# Patient Record
Sex: Female | Born: 1940 | Race: White | Hispanic: No | Smoking: Never smoker
Health system: Southern US, Community
[De-identification: ages and names within clinical notes are randomized; demographics above are authoritative.]

## PROBLEM LIST (undated history)

## (undated) DIAGNOSIS — E079 Disorder of thyroid, unspecified: Secondary | ICD-10-CM

## (undated) DIAGNOSIS — I10 Essential (primary) hypertension: Secondary | ICD-10-CM

## (undated) DIAGNOSIS — J383 Other diseases of vocal cords: Secondary | ICD-10-CM

## (undated) DIAGNOSIS — E119 Type 2 diabetes mellitus without complications: Secondary | ICD-10-CM

## (undated) DIAGNOSIS — F419 Anxiety disorder, unspecified: Secondary | ICD-10-CM

## (undated) DIAGNOSIS — M1990 Unspecified osteoarthritis, unspecified site: Secondary | ICD-10-CM

## (undated) DIAGNOSIS — K219 Gastro-esophageal reflux disease without esophagitis: Secondary | ICD-10-CM

## (undated) DIAGNOSIS — E559 Vitamin D deficiency, unspecified: Secondary | ICD-10-CM

## (undated) DIAGNOSIS — G629 Polyneuropathy, unspecified: Secondary | ICD-10-CM

## (undated) DIAGNOSIS — M8580 Other specified disorders of bone density and structure, unspecified site: Secondary | ICD-10-CM

## (undated) DIAGNOSIS — E785 Hyperlipidemia, unspecified: Secondary | ICD-10-CM

## (undated) DIAGNOSIS — E7800 Pure hypercholesterolemia, unspecified: Secondary | ICD-10-CM

## (undated) DIAGNOSIS — C801 Malignant (primary) neoplasm, unspecified: Secondary | ICD-10-CM

## (undated) HISTORY — PX: EYE SURGERY: SHX253

---

## 2017-09-28 ENCOUNTER — Emergency Department
Admission: EM | Admit: 2017-09-28 | Discharge: 2017-09-28 | Disposition: A | Discharge status: Home / Self Care | Payer: Medicare Other | Attending: Emergency Medicine | Admitting: Emergency Medicine

## 2017-09-28 ENCOUNTER — Encounter: Payer: Self-pay | Admitting: Emergency Medicine

## 2017-09-28 DIAGNOSIS — E876 Hypokalemia: Secondary | ICD-10-CM | POA: Insufficient documentation

## 2017-09-28 DIAGNOSIS — I10 Essential (primary) hypertension: Secondary | ICD-10-CM | POA: Diagnosis present

## 2017-09-28 DIAGNOSIS — Z85820 Personal history of malignant melanoma of skin: Secondary | ICD-10-CM | POA: Insufficient documentation

## 2017-09-28 DIAGNOSIS — E1140 Type 2 diabetes mellitus with diabetic neuropathy, unspecified: Secondary | ICD-10-CM | POA: Insufficient documentation

## 2017-09-28 HISTORY — DX: Pure hypercholesterolemia, unspecified: E78.00

## 2017-09-28 HISTORY — DX: Unspecified osteoarthritis, unspecified site: M19.90

## 2017-09-28 HISTORY — DX: Malignant (primary) neoplasm, unspecified: C80.1

## 2017-09-28 HISTORY — DX: Polyneuropathy, unspecified: G62.9

## 2017-09-28 HISTORY — DX: Other specified disorders of bone density and structure, unspecified site: M85.80

## 2017-09-28 HISTORY — DX: Vitamin D deficiency, unspecified: E55.9

## 2017-09-28 HISTORY — DX: Disorder of thyroid, unspecified: E07.9

## 2017-09-28 HISTORY — DX: Type 2 diabetes mellitus without complications: E11.9

## 2017-09-28 HISTORY — DX: Gastro-esophageal reflux disease without esophagitis: K21.9

## 2017-09-28 HISTORY — DX: Essential (primary) hypertension: I10

## 2017-09-28 HISTORY — DX: Hyperlipidemia, unspecified: E78.5

## 2017-09-28 HISTORY — DX: Anxiety disorder, unspecified: F41.9

## 2017-09-28 HISTORY — DX: Other diseases of vocal cords: J38.3

## 2017-09-28 LAB — BASIC METABOLIC PANEL
ANION GAP: 10 (ref 5–15)
BUN: 14 mg/dL (ref 6–20)
CHLORIDE: 99 mmol/L — AB (ref 101–111)
CO2: 25 mmol/L (ref 22–32)
Calcium: 9 mg/dL (ref 8.9–10.3)
Creatinine, Ser: 0.69 mg/dL (ref 0.44–1.00)
GFR calc non Af Amer: >60 mL/min (ref >60)
GLUCOSE: 100 mg/dL — AB (ref 65–99)
Potassium: 3.3 mmol/L — ABNORMAL LOW (ref 3.5–5.1)
Sodium: 134 mmol/L — ABNORMAL LOW (ref 135–145)

## 2017-09-28 LAB — TROPONIN I: Troponin I: <0.03 ng/mL (ref <0.03)

## 2017-09-28 LAB — CBC
HEMATOCRIT: 41.1 % (ref 35.0–47.0)
HEMOGLOBIN: 14 g/dL (ref 12.0–16.0)
MCH: 30.8 pg (ref 26.0–34.0)
MCHC: 34 g/dL (ref 32.0–36.0)
MCV: 90.4 fL (ref 80.0–100.0)
Platelets: 278 10*3/uL (ref 150–440)
RBC: 4.54 MIL/uL (ref 3.80–5.20)
RDW: 13 % (ref 11.5–14.5)
WBC: 9.6 10*3/uL (ref 3.6–11.0)

## 2017-09-28 MED ORDER — LOSARTAN POTASSIUM 25 MG PO TABS: 75.0000 mg | ORAL_TABLET | Freq: Every day | ORAL | 1 refills | Status: AC

## 2017-09-28 MED ORDER — AMLODIPINE BESYLATE 5 MG PO TABS
5.0000 mg | ORAL_TABLET | Freq: Once | ORAL | Status: DC
  Filled 2017-09-28: qty 1

## 2017-09-28 MED ORDER — LOSARTAN POTASSIUM 50 MG PO TABS
50.0000 mg | ORAL_TABLET | Freq: Once | ORAL | Status: AC
  Administered 2017-09-28: 50 mg via ORAL
  Filled 2017-09-28: qty 1

## 2017-09-28 NOTE — ED Notes (Signed)
FN: pt states is out of town visiting her son and is having issues with her blood pressure. Takes meds for same. States she is having some dizziness. NAD at time of check  In. Pt denies any chest pain and or shortness of breath.

## 2017-09-28 NOTE — ED Triage Notes (Addendum)
Patient states that she recently moved here. Patient states that she has a history of hypertension. Patient states that 09/20/17 her blood pressure was 164/90 and was seen at [facility]. Patient states that she was started on Clonidine and that he helped her blood pressure but made her feel bad. Patient states that she made her an appointment with a new PCP but her appointment until 10/19/17. Patient states that she took her blood pressure at home today and it was 177/94. Patient states that she would like her Losartan increased or started on a new medication. Patient denies any chest pain. Patient reports that she is feeling dizzy.

## 2017-09-28 NOTE — ED Provider Notes (Signed)
[facility] Emergency Department Provider Note  ____________________________________________  Time seen: Approximately 11:21 PM  I have reviewed the triage vital signs and the nursing notes.   HISTORY  Chief Complaint Hypertension   HPI Autumn Kerr is a 77 y.o. female the history of hypertension who presents for evaluation of elevated blood pressure. Patient reports that she is here visiting her son. She is on losartan 25 mg daily. She has been having elevated blood pressure since she came to visit her son in [place] for the last few weeks. She reports that she was started on clonidine 10 days ago at urgent care however the medication made her feel very dizzy and she discontinued it. She denies headaches, dizziness, chest pain, shortness of breath, back pain, decreased urine output.   Past Medical History:  Diagnosis Date  . Anxiety   . Arthritis   . Cancer ([place])    malignant melanoma of skin  . Diabetes mellitus without complication ([place])   . GERD (gastroesophageal reflux disease)   . Hypercholesterolemia   . Hyperlipemia   . Hypertension   . Neuropathy   . Osteoarthritis   . Osteopenia   . Thyroid disease   . Vitamin D deficiency   . Vocal cord dysfunction     There are no active problems to display for this patient.   Past Surgical History:  Procedure Laterality Date  . EYE SURGERY Right    cataract    Prior to Admission medications   Not on File    Allergies Levothyroxine; Lisinopril; and Prednisone  No family history on file.  Social History Social History   Tobacco Use  . Smoking status: Never Smoker  . Smokeless tobacco: Never Used  Substance Use Topics  . Alcohol use: Yes    Comment: occ  . Drug use: No    Review of Systems  Constitutional: Negative for fever. Eyes: Negative for visual changes. ENT: Negative for sore throat. Neck: No neck pain  Cardiovascular: Negative for chest pain. Respiratory:  Negative for shortness of breath. Gastrointestinal: Negative for abdominal pain, vomiting or diarrhea. Genitourinary: Negative for dysuria. Musculoskeletal: Negative for back pain. Skin: Negative for rash. Neurological: Negative for headaches, weakness or numbness. Psych: No SI or HI  ____________________________________________   PHYSICAL EXAM:  VITAL SIGNS: ED Triage Vitals  Enc Vitals Group     BP 09/28/17 1837 (!) 186/94     Pulse Rate 09/28/17 1837 67     Resp 09/28/17 1837 20     Temp 09/28/17 1837 98.3 F (36.8 C)     Temp Source 09/28/17 1837 Oral     SpO2 09/28/17 1837 99 %     Weight 09/28/17 1838 171 lb (77.6 kg)     Height 09/28/17 1838 5\' 4"  (1.626 m)     Head Circumference --      Peak Flow --      Pain Score --      Pain Loc --      Pain Edu? --      Excl. in [place]? --     Constitutional: Alert and oriented. Well appearing and in no apparent distress. HEENT:      Head: Normocephalic and atraumatic.         Eyes: Conjunctivae are normal. Sclera is non-icteric.       Mouth/Throat: Mucous membranes are moist.       Neck: Supple with no signs of meningismus. Cardiovascular: Regular rate and rhythm. No murmurs, gallops,  or rubs. 2+ symmetrical distal pulses are present in all extremities. No JVD. Respiratory: Normal respiratory effort. Lungs are clear to auscultation bilaterally. No wheezes, crackles, or rhonchi.  Gastrointestinal: Soft, non tender, and non distended with positive bowel sounds. No rebound or guarding. Genitourinary: No CVA tenderness. Musculoskeletal: Nontender with normal range of motion in all extremities. No edema, cyanosis, or erythema of extremities. Neurologic: Normal speech and language. Face is symmetric. Moving all extremities. No gross focal neurologic deficits are appreciated. Skin: Skin is warm, dry and intact. No rash noted. Psychiatric: Mood and affect are normal. Speech and behavior are  normal.  ____________________________________________   LABS (all labs ordered are listed, but only abnormal results are displayed)  Labs Reviewed  BASIC METABOLIC PANEL - Abnormal; Notable for the following components:      Result Value   Sodium 134 (*)    Potassium 3.3 (*)    Chloride 99 (*)    Glucose, Bld 100 (*)    All other components within normal limits  CBC  TROPONIN I   ____________________________________________  EKG  ED ECG REPORT I, Rudene Re, the attending physician, personally viewed and interpreted this ECG.  Normal sinus rhythm, rate of 74, normal intervals, normal axis, no ST elevations or depressions. ____________________________________________  RADIOLOGY  none  ____________________________________________   PROCEDURES  Procedure(s) performed: None Procedures Critical Care performed:  None ____________________________________________   INITIAL IMPRESSION / ASSESSMENT AND PLAN / ED COURSE  77 y.o. female the history of hypertension who presents for evaluation of asymptomatic elevated blood pressure. Patient is on a very small dose of losartan, 25 mg. Her EKG shows no ischemic changes and labs show no end organ damage. I will increase her losartan to 75 mg a day and asked patient to keep a blood pressure diary. Recommend close follow-up in 2 days for further evaluation. Discussed signs and symptoms of an organ damage and recommended return to the emergency room if these develop. At this time since patient is asymptomatic she will be discharged home.      As part of my medical decision making, I reviewed the following data within the [place] notes reviewed and incorporated, Labs reviewed , EKG interpreted , Notes from prior ED visits and [place] Controlled Substance Database    Pertinent labs & imaging results that were available during my care of the patient were reviewed by me and considered in my medical decision  making (see chart for details).    ____________________________________________   FINAL CLINICAL IMPRESSION(S) / ED DIAGNOSES  Final diagnoses:  Essential hypertension  Hypokalemia      NEW MEDICATIONS STARTED DURING THIS VISIT:  ED Discharge Orders    None       Note:  This document was prepared using Dragon voice recognition software and may include unintentional dictation errors.    Rudene Re, MD 09/28/17 [phone]

## 2017-10-02 ENCOUNTER — Inpatient Hospital Stay
Admission: EM | Admit: 2017-10-02 | Discharge: 2017-10-04 | DRG: 641 | Disposition: A | Discharge status: Home / Self Care | Payer: Medicare Other | Attending: Internal Medicine | Admitting: Internal Medicine

## 2017-10-02 ENCOUNTER — Encounter: Payer: Self-pay | Admitting: Emergency Medicine

## 2017-10-02 ENCOUNTER — Inpatient Hospital Stay: Payer: Medicare Other

## 2017-10-02 ENCOUNTER — Other Ambulatory Visit: Payer: Self-pay

## 2017-10-02 ENCOUNTER — Emergency Department: Payer: Medicare Other

## 2017-10-02 DIAGNOSIS — R202 Paresthesia of skin: Secondary | ICD-10-CM | POA: Diagnosis not present

## 2017-10-02 DIAGNOSIS — E871 Hypo-osmolality and hyponatremia: Principal | ICD-10-CM | POA: Diagnosis present

## 2017-10-02 DIAGNOSIS — I10 Essential (primary) hypertension: Secondary | ICD-10-CM | POA: Diagnosis present

## 2017-10-02 DIAGNOSIS — N390 Urinary tract infection, site not specified: Secondary | ICD-10-CM | POA: Diagnosis not present

## 2017-10-02 DIAGNOSIS — Z85820 Personal history of malignant melanoma of skin: Secondary | ICD-10-CM | POA: Diagnosis not present

## 2017-10-02 DIAGNOSIS — Z79899 Other long term (current) drug therapy: Secondary | ICD-10-CM | POA: Diagnosis not present

## 2017-10-02 DIAGNOSIS — E876 Hypokalemia: Secondary | ICD-10-CM | POA: Diagnosis present

## 2017-10-02 DIAGNOSIS — R4781 Slurred speech: Secondary | ICD-10-CM | POA: Diagnosis present

## 2017-10-02 DIAGNOSIS — Z888 Allergy status to other drugs, medicaments and biological substances status: Secondary | ICD-10-CM

## 2017-10-02 DIAGNOSIS — E119 Type 2 diabetes mellitus without complications: Secondary | ICD-10-CM | POA: Diagnosis present

## 2017-10-02 DIAGNOSIS — E039 Hypothyroidism, unspecified: Secondary | ICD-10-CM | POA: Diagnosis present

## 2017-10-02 DIAGNOSIS — R531 Weakness: Secondary | ICD-10-CM

## 2017-10-02 DIAGNOSIS — E878 Other disorders of electrolyte and fluid balance, not elsewhere classified: Secondary | ICD-10-CM | POA: Diagnosis present

## 2017-10-02 LAB — URINE DRUG SCREEN, QUALITATIVE (ARMC ONLY)
AMPHETAMINES, UR SCREEN: NOT DETECTED
BARBITURATES, UR SCREEN: NOT DETECTED
Benzodiazepine, Ur Scrn: NOT DETECTED
Cannabinoid 50 Ng, Ur ~~LOC~~: NOT DETECTED
Cocaine Metabolite,Ur ~~LOC~~: NOT DETECTED
MDMA (Ecstasy)Ur Screen: NOT DETECTED
METHADONE SCREEN, URINE: NOT DETECTED
OPIATE, UR SCREEN: NOT DETECTED
Phencyclidine (PCP) Ur S: NOT DETECTED
TRICYCLIC, UR SCREEN: NOT DETECTED

## 2017-10-02 LAB — CBC
HEMATOCRIT: 40 % (ref 35.0–47.0)
Hemoglobin: 13.9 g/dL (ref 12.0–16.0)
MCH: 31 pg (ref 26.0–34.0)
MCHC: 34.7 g/dL (ref 32.0–36.0)
MCV: 89.2 fL (ref 80.0–100.0)
PLATELETS: 291 10*3/uL (ref 150–440)
RBC: 4.48 MIL/uL (ref 3.80–5.20)
RDW: 12.9 % (ref 11.5–14.5)
WBC: 7 10*3/uL (ref 3.6–11.0)

## 2017-10-02 LAB — BASIC METABOLIC PANEL
Anion gap: 8 (ref 5–15)
BUN: 12 mg/dL (ref 6–20)
CHLORIDE: 89 mmol/L — AB (ref 101–111)
CO2: 25 mmol/L (ref 22–32)
CREATININE: 0.75 mg/dL (ref 0.44–1.00)
Calcium: 8.9 mg/dL (ref 8.9–10.3)
GFR calc Af Amer: >60 mL/min (ref >60)
GFR calc non Af Amer: >60 mL/min (ref >60)
GLUCOSE: 99 mg/dL (ref 65–99)
POTASSIUM: 3.4 mmol/L — AB (ref 3.5–5.1)
SODIUM: 122 mmol/L — AB (ref 135–145)

## 2017-10-02 LAB — URINALYSIS, COMPLETE (UACMP) WITH MICROSCOPIC
BILIRUBIN URINE: NEGATIVE
Glucose, UA: NEGATIVE mg/dL
Hgb urine dipstick: NEGATIVE
Ketones, ur: NEGATIVE mg/dL
Nitrite: POSITIVE — AB
PH: 6 (ref 5.0–8.0)
Protein, ur: NEGATIVE mg/dL
SPECIFIC GRAVITY, URINE: 1.002 — AB (ref 1.005–1.030)

## 2017-10-02 LAB — GLUCOSE, CAPILLARY
GLUCOSE-CAPILLARY: 73 mg/dL (ref 65–99)
GLUCOSE-CAPILLARY: 74 mg/dL (ref 65–99)

## 2017-10-02 LAB — TSH: TSH: 0.522 u[IU]/mL (ref 0.350–4.500)

## 2017-10-02 MED ORDER — LEVOTHYROXINE SODIUM 125 MCG PO TABS
125.0000 ug | ORAL_TABLET | Freq: Every day | ORAL | Status: DC
  Filled 2017-10-02 (×2): qty 1

## 2017-10-02 MED ORDER — ACETAMINOPHEN 325 MG PO TABS
650.0000 mg | ORAL_TABLET | Freq: Four times a day (QID) | ORAL | Status: DC | PRN
Start: 2017-10-02 — End: 2017-10-04

## 2017-10-02 MED ORDER — STROKE: EARLY STAGES OF RECOVERY BOOK
Freq: Once | Status: AC
  Administered 2017-10-02: 21:00:00

## 2017-10-02 MED ORDER — ACETAMINOPHEN 650 MG RE SUPP: 650.0000 mg | Freq: Four times a day (QID) | RECTAL | Status: DC | PRN

## 2017-10-02 MED ORDER — ACETAMINOPHEN 160 MG/5ML PO SOLN
650.0000 mg | ORAL | Status: DC | PRN
  Filled 2017-10-02: qty 20.3

## 2017-10-02 MED ORDER — FAMOTIDINE 20 MG PO TABS
20.0000 mg | ORAL_TABLET | Freq: Every day | ORAL | Status: DC
  Administered 2017-10-04: 11:00:00 20 mg via ORAL
  Filled 2017-10-02: qty 1

## 2017-10-02 MED ORDER — POTASSIUM CHLORIDE 2 MEQ/ML IV SOLN
INTRAVENOUS | Status: DC
  Administered 2017-10-02: 23:00:00 via INTRAVENOUS
  Filled 2017-10-02 (×2): qty 1000

## 2017-10-02 MED ORDER — ONDANSETRON HCL 4 MG PO TABS: 4.0000 mg | ORAL_TABLET | Freq: Four times a day (QID) | ORAL | Status: DC | PRN

## 2017-10-02 MED ORDER — SODIUM CHLORIDE 1 G PO TABS
2.0000 g | ORAL_TABLET | Freq: Three times a day (TID) | ORAL | Status: DC
  Filled 2017-10-02: qty 2

## 2017-10-02 MED ORDER — ASPIRIN 325 MG PO TABS
325.0000 mg | ORAL_TABLET | Freq: Every day | ORAL | Status: DC
  Filled 2017-10-02: qty 1

## 2017-10-02 MED ORDER — ONDANSETRON HCL 4 MG/2ML IJ SOLN: 4.0000 mg | Freq: Four times a day (QID) | INTRAMUSCULAR | Status: DC | PRN

## 2017-10-02 MED ORDER — ATENOLOL 25 MG PO TABS
25.0000 mg | ORAL_TABLET | Freq: Every day | ORAL | Status: DC
  Filled 2017-10-02: qty 1

## 2017-10-02 MED ORDER — MORPHINE SULFATE (PF) 2 MG/ML IV SOLN: 1.0000 mg | INTRAVENOUS | Status: DC | PRN

## 2017-10-02 MED ORDER — SODIUM CHLORIDE 0.9 % IV SOLN
INTRAVENOUS | Status: DC
  Administered 2017-10-02: 21:00:00 via INTRAVENOUS

## 2017-10-02 MED ORDER — ASPIRIN 300 MG RE SUPP
300.0000 mg | Freq: Once | RECTAL | Status: AC
  Administered 2017-10-02: 300 mg via RECTAL
  Filled 2017-10-02: qty 1

## 2017-10-02 MED ORDER — INSULIN ASPART 100 UNIT/ML ~~LOC~~ SOLN
0.0000 [IU] | Freq: Three times a day (TID) | SUBCUTANEOUS | Status: DC
  Administered 2017-10-04: 2 [IU] via SUBCUTANEOUS
  Filled 2017-10-02: qty 1

## 2017-10-02 MED ORDER — HYDRALAZINE HCL 20 MG/ML IJ SOLN: 10.0000 mg | INTRAMUSCULAR | Status: DC | PRN

## 2017-10-02 MED ORDER — INSULIN ASPART 100 UNIT/ML ~~LOC~~ SOLN: 0.0000 [IU] | Freq: Every day | SUBCUTANEOUS | Status: DC

## 2017-10-02 MED ORDER — LOSARTAN POTASSIUM 50 MG PO TABS
75.0000 mg | ORAL_TABLET | Freq: Every day | ORAL | Status: DC
  Administered 2017-10-04: 11:00:00 75 mg via ORAL
  Filled 2017-10-02: qty 2

## 2017-10-02 MED ORDER — ENOXAPARIN SODIUM 40 MG/0.4ML ~~LOC~~ SOLN
40.0000 mg | SUBCUTANEOUS | Status: DC
  Administered 2017-10-02 – 2017-10-03 (×2): 40 mg via SUBCUTANEOUS
  Filled 2017-10-02 (×2): qty 0.4

## 2017-10-02 MED ORDER — HYDROCODONE-ACETAMINOPHEN 5-325 MG PO TABS: 1.0000 | ORAL_TABLET | ORAL | Status: DC | PRN

## 2017-10-02 MED ORDER — ATORVASTATIN CALCIUM 20 MG PO TABS: 20.0000 mg | ORAL_TABLET | Freq: Every day | ORAL | Status: DC

## 2017-10-02 MED ORDER — DIAZEPAM 5 MG PO TABS: 5.0000 mg | ORAL_TABLET | Freq: Three times a day (TID) | ORAL | Status: DC | PRN

## 2017-10-02 MED ORDER — SODIUM CHLORIDE 0.9 % IV BOLUS (SEPSIS)
1000.0000 mL | Freq: Once | INTRAVENOUS | Status: AC
  Administered 2017-10-02: 1000 mL via INTRAVENOUS

## 2017-10-02 MED ORDER — ACETAMINOPHEN 650 MG RE SUPP: 650.0000 mg | RECTAL | Status: DC | PRN

## 2017-10-02 MED ORDER — DEXTROSE 5 % IV SOLN
1.0000 g | INTRAVENOUS | Status: DC
  Administered 2017-10-03: 1 g via INTRAVENOUS
  Filled 2017-10-02 (×2): qty 10

## 2017-10-02 MED ORDER — DOCUSATE SODIUM 100 MG PO CAPS: 100.0000 mg | ORAL_CAPSULE | Freq: Two times a day (BID) | ORAL | Status: DC

## 2017-10-02 MED ORDER — POLYETHYLENE GLYCOL 3350 17 G PO PACK: 17.0000 g | PACK | Freq: Every day | ORAL | Status: DC | PRN

## 2017-10-02 MED ORDER — ADULT MULTIVITAMIN W/MINERALS CH
1.0000 | ORAL_TABLET | Freq: Every day | ORAL | Status: DC
  Administered 2017-10-04: 1 via ORAL
  Filled 2017-10-02: qty 1

## 2017-10-02 MED ORDER — ACETAMINOPHEN 325 MG PO TABS: 650.0000 mg | ORAL_TABLET | ORAL | Status: DC | PRN

## 2017-10-02 MED ORDER — CEFTRIAXONE SODIUM IN DEXTROSE 20 MG/ML IV SOLN
1.0000 g | Freq: Once | INTRAVENOUS | Status: AC
  Administered 2017-10-02: 1 g via INTRAVENOUS
  Filled 2017-10-02: qty 50

## 2017-10-02 MED ORDER — ENOXAPARIN SODIUM 40 MG/0.4ML ~~LOC~~ SOLN: 40.0000 mg | SUBCUTANEOUS | Status: DC

## 2017-10-02 NOTE — ED Notes (Signed)
Pt up to toilet 

## 2017-10-02 NOTE — ED Provider Notes (Signed)
[facility] Emergency Department Provider Note  ____________________________________________   I have reviewed the triage vital signs and the nursing notes.   HISTORY  Chief Complaint Weakness and Tingling   History limited by: Not Limited   HPI Autumn Kerr is a 77 y.o. female who presents to the emergency department today because of concern for weakness and tingling. She states that she has been feeling increasingly weak. She has also had associated tingling of her lower extremities. The patient states that this problem has been going on for a long time, however it has gotten worse the past day. She has also had lower abdominal pain. When she was in the emergency department four days ago she was told to increase her losartan dose, however she only did that one day because she felt like it dropped her blood pressure too low.    Per medical record review patient has a history of anxiety, htn.   Past Medical History:  Diagnosis Date  . Anxiety   . Arthritis   . Cancer ([place])    malignant melanoma of skin  . Diabetes mellitus without complication ([place])   . GERD (gastroesophageal reflux disease)   . Hypercholesterolemia   . Hyperlipemia   . Hypertension   . Neuropathy   . Osteoarthritis   . Osteopenia   . Thyroid disease   . Vitamin D deficiency   . Vocal cord dysfunction     There are no active problems to display for this patient.   Past Surgical History:  Procedure Laterality Date  . EYE SURGERY Right    cataract    Prior to Admission medications   Medication Sig Start Date End Date Taking? Authorizing Provider  losartan (COZAAR) 25 MG tablet Take 3 tablets (75 mg total) by mouth daily. 09/28/17   Rudene Re, MD    Allergies Levothyroxine; Lisinopril; and Prednisone  History reviewed. No pertinent family history.  Social History Social History   Tobacco Use  . Smoking status: Never Smoker  . Smokeless tobacco: Never Used   Substance Use Topics  . Alcohol use: Yes    Comment: occ  . Drug use: No    Review of Systems Constitutional: No fever/chills. Positive for generalized weakness.  Eyes: No visual changes. ENT: No sore throat. Cardiovascular: Denies chest pain. Respiratory: Denies shortness of breath. Gastrointestinal: Positive for lower abdominal pain. Genitourinary: Negative for dysuria. Musculoskeletal: Negative for back pain. Skin: Negative for rash. Neurological: Positive for lower extremity tingling.  ____________________________________________   PHYSICAL EXAM:  VITAL SIGNS: ED Triage Vitals  Enc Vitals Group     BP 10/02/17 1615 (!) 153/116     Pulse Rate 10/02/17 1615 67     Resp 10/02/17 1615 18     Temp 10/02/17 1615 98.6 F (37 C)     Temp Source 10/02/17 1615 Oral     SpO2 10/02/17 1615 97 %     Weight 10/02/17 1616 171 lb (77.6 kg)     Height 10/02/17 1616 5' 4.5" (1.638 m)     Head Circumference --      Peak Flow --      Pain Score 10/02/17 1615 8   Constitutional: Alert and oriented. Well appearing and in no distress. Eyes: Conjunctivae are normal.  ENT   Head: Normocephalic and atraumatic.   Nose: No congestion/rhinnorhea.   Mouth/Throat: Mucous membranes are moist.   Neck: No stridor. Hematological/Lymphatic/Immunilogical: No cervical lymphadenopathy. Cardiovascular: Normal rate, regular rhythm.  No murmurs, rubs, or gallops.  Respiratory: Normal respiratory effort without tachypnea nor retractions. Breath sounds are clear and equal bilaterally. No wheezes/rales/rhonchi. Gastrointestinal: Soft and non tender. No rebound. No guarding.  Genitourinary: Deferred Musculoskeletal: Normal range of motion in all extremities. No lower extremity edema. Neurologic:  Normal speech and language. No gross focal neurologic deficits are appreciated.  Skin:  Skin is warm, dry and intact. No rash noted. Psychiatric: Mood and affect are normal. Speech and behavior are  normal. Patient exhibits appropriate insight and judgment.  ____________________________________________    LABS (pertinent positives/negatives)  CBC wnl BMP na 122, k 3.4, 0.75 UA positive nitrite, UA 6-30  ____________________________________________   EKG  I, Nance Pear, attending physician, personally viewed and interpreted this EKG  EKG Time: 1622 Rate: 63 Rhythm: normal sinus rhythm Axis: normal Intervals: qtc 431 QRS: incomplete RBBB ST changes: no st elevation Impression: abnormal ekg   ____________________________________________    RADIOLOGY  CT head No acute intracranial process.  ____________________________________________   PROCEDURES  Procedures  ____________________________________________   INITIAL IMPRESSION / ASSESSMENT AND PLAN / ED COURSE  Pertinent labs & imaging results that were available during my care of the patient were reviewed by me and considered in my medical decision making (see chart for details).  Patient presented to the emergency department today because of concern for weakness, tingling. ddx would be broad including stroke, electrolyte abnormality, infection amongst other etiologies. CT head was negative today. Blood work did show hyponatremia. This is a significant change from blood work four days ago. Additionally urine was consistent with infection. Will plan on admission. Discussed plan and findings with patient and family.   ____________________________________________   FINAL CLINICAL IMPRESSION(S) / ED DIAGNOSES  Final diagnoses:  Weakness  Paresthesias  Lower urinary tract infectious disease     Note: This dictation was prepared with Dragon dictation. Any transcriptional errors that result from this process are unintentional     Nance Pear, MD 10/03/17 1726

## 2017-10-02 NOTE — ED Triage Notes (Signed)
Pt reports that she has history of hypertension, she states that she noticed her B/P being elevated again and that she is having numbness and tingling all over. She has slurred speech but this is not new according to her son. She has had slurred speech for the last 6 months. She reports that she took a walk today and was more weak than normal.

## 2017-10-02 NOTE — H&P (Addendum)
[place] [facility] [place] NAME: Autumn Kerr    MR#:  [id number]  DATE OF BIRTH:  1941-07-09  DATE OF ADMISSION:  10/02/2017  PRIMARY CARE PHYSICIAN: Glendon Axe, MD   REQUESTING/REFERRING PHYSICIAN:   CHIEF COMPLAINT:   Chief Complaint  Patient presents with  . Weakness  . Tingling    HISTORY OF PRESENT ILLNESS: Autumn Kerr  is a 77 y.o. female with a known history below presenting with acute pins and needles all over her entire body, dizziness, slurred speech, generalized weakness all over, was concern for high blood pressure holding, emergency room patient was found to have urinalysis suspicious for UTI, hyponatremia of 122, chloride 89, potassium 3.4, patient to the emergency room, no distress, no neurological deficits noted, patient is now being admitted for acute severe hyponatremia, probable acute urinary tract infection, and paresthesias.  PAST MEDICAL HISTORY:   Past Medical History:  Diagnosis Date  . Anxiety   . Arthritis   . Cancer ([place])    malignant melanoma of skin  . Diabetes mellitus without complication ([place])   . GERD (gastroesophageal reflux disease)   . Hypercholesterolemia   . Hyperlipemia   . Hypertension   . Neuropathy   . Osteoarthritis   . Osteopenia   . Thyroid disease   . Vitamin D deficiency   . Vocal cord dysfunction     PAST SURGICAL HISTORY:  Past Surgical History:  Procedure Laterality Date  . EYE SURGERY Right    cataract    SOCIAL HISTORY:  Social History   Tobacco Use  . Smoking status: Never Smoker  . Smokeless tobacco: Never Used  Substance Use Topics  . Alcohol use: Yes    Comment: occ    FAMILY HISTORY: History reviewed. No pertinent family history.  DRUG ALLERGIES:  Allergies  Allergen Reactions  . Levothyroxine Other (See Comments)    intolerance  . Lisinopril Cough  . Prednisone     REVIEW OF SYSTEMS:   CONSTITUTIONAL: No fever, fatigue, generalized weakness.  EYES:  No blurred or double vision.  EARS, NOSE, AND THROAT: No tinnitus or ear pain.  RESPIRATORY: No cough, shortness of breath, wheezing or hemoptysis.  CARDIOVASCULAR: No chest pain, orthopnea, edema.  GASTROINTESTINAL: No nausea, vomiting, diarrhea or abdominal pain.  GENITOURINARY: No dysuria, hematuria.  ENDOCRINE: No polyuria, nocturia,  HEMATOLOGY: No anemia, easy bruising or bleeding SKIN: No rash or lesion. MUSCULOSKELETAL: No joint pain or arthritis.   NEUROLOGIC: No tingling, numbness, weakness. Pins and needles over entire body, slurred speech PSYCHIATRY: No anxiety or depression.   MEDICATIONS AT HOME:  Prior to Admission medications   Medication Sig Start Date End Date Taking? Authorizing Provider  losartan (COZAAR) 25 MG tablet Take 3 tablets (75 mg total) by mouth daily. Patient taking differently: Take 25 mg by mouth daily.  09/28/17  Yes [place], [place], MD  ranitidine (ZANTAC) 150 MG tablet Take 150 mg by mouth 2 (two) times daily. 09/27/17  Yes [provider]  TIROSINT 125 MCG CAPS Take 125 mcg by mouth daily. 08/13/17  Yes [provider]      PHYSICAL EXAMINATION:   VITAL SIGNS: Blood pressure (!) 146/69, pulse (!) 58, temperature 98.6 F (37 C), temperature source Oral, resp. rate 18, height 5' 4.5" (1.638 m), weight 77.6 kg (171 lb), SpO2 98 %.  GENERAL:  77 y.o.-year-old patient lying in the bed with no acute distress.  EYES: Pupils equal, round, reactive to light and accommodation. No  scleral icterus. Extraocular muscles intact.  HEENT: Head atraumatic, normocephalic. Oropharynx and nasopharynx clear.  NECK:  Supple, no jugular venous distention. No thyroid enlargement, no tenderness.  LUNGS: Normal breath sounds bilaterally, no wheezing, rales,rhonchi or crepitation. No use of accessory muscles of respiration.  CARDIOVASCULAR: S1, S2 normal. No murmurs, rubs, or gallops.  ABDOMEN: Soft, nontender, nondistended. Bowel sounds present. No  organomegaly or mass.  EXTREMITIES: No pedal edema, cyanosis, or clubbing.  NEUROLOGIC: Cranial nerves II through XII are intact. Muscle strength 5/5 in all extremities. Sensation intact. Gait not checked.  PSYCHIATRIC: The patient is alert and oriented x 3.  SKIN: No obvious rash, lesion, or ulcer.   LABORATORY PANEL:   CBC Recent Labs  Lab 09/28/17 1911 10/02/17 1618  WBC 9.6 7.0  HGB 14.0 13.9  HCT 41.1 40.0  PLT 278 291  MCV 90.4 89.2  MCH 30.8 31.0  MCHC 34.0 34.7  RDW 13.0 12.9   ------------------------------------------------------------------------------------------------------------------  Chemistries  Recent Labs  Lab 09/28/17 1911 10/02/17 1618  NA 134* 122*  K 3.3* 3.4*  CL 99* 89*  CO2 25 25  GLUCOSE 100* 99  BUN 14 12  CREATININE 0.69 0.75  CALCIUM 9.0 8.9   ------------------------------------------------------------------------------------------------------------------ estimated creatinine clearance is 62 mL/min (by C-G formula based on SCr of 0.75 mg/dL). ------------------------------------------------------------------------------------------------------------------ No results for input(s): TSH, T4TOTAL, T3FREE, THYROIDAB in the last 72 hours.  Invalid input(s): FREET3   Coagulation profile No results for input(s): INR, PROTIME in the last 168 hours. ------------------------------------------------------------------------------------------------------------------- No results for input(s): DDIMER in the last 72 hours. -------------------------------------------------------------------------------------------------------------------  Cardiac Enzymes Recent Labs  Lab 09/28/17 1911  TROPONINI <0.03   ------------------------------------------------------------------------------------------------------------------ Invalid input(s):  POCBNP  ---------------------------------------------------------------------------------------------------------------  Urinalysis    Component Value Date/Time   COLORURINE YELLOW (A) 10/02/2017 1618   APPEARANCEUR HAZY (A) 10/02/2017 1618   LABSPEC 1.002 (L) 10/02/2017 1618   PHURINE 6.0 10/02/2017 1618   GLUCOSEU NEGATIVE 10/02/2017 1618   HGBUR NEGATIVE 10/02/2017 1618   BILIRUBINUR NEGATIVE 10/02/2017 1618   KETONESUR NEGATIVE 10/02/2017 1618   PROTEINUR NEGATIVE 10/02/2017 1618   NITRITE POSITIVE (A) 10/02/2017 1618   LEUKOCYTESUR SMALL (A) 10/02/2017 1618     RADIOLOGY: Ct Head Wo Contrast  Result Date: 10/02/2017 CLINICAL DATA:  Slurred speech. Numbness and tingling. Subacute neurologic deficit. Hypertension. EXAM: CT HEAD WITHOUT CONTRAST TECHNIQUE: Contiguous axial images were obtained from the base of the skull through the vertex without intravenous contrast. COMPARISON:  None. FINDINGS: Brain: No evidence of acute infarction, hemorrhage, hydrocephalus, extra-axial collection, or mass lesion/mass effect. Vascular:  No hyperdense vessel or other acute findings. Skull: No evidence of fracture or other significant bone abnormality. Sinuses/Orbits:  No acute findings. Other: None. IMPRESSION: Negative noncontrast head CT. Electronically Signed   By: Earle Gell M.D.   On: 10/02/2017 17:09    EKG: Orders placed or performed during the hospital encounter of 10/02/17  . ED EKG  . ED EKG  . EKG 12-Lead  . EKG 12-Lead    IMPRESSION AND PLAN: 1 acute severe hyponatremia Most likely secondary to poor by mouth intake Admitted to RNF, IV fluids for rehydration, salt tablets, BMP in the morning  2 acute possible urinary tract infection Rocephin IV for 3 course of follow-up on cultures  3 acute hypochloremia/hypokalemia Replete with IV fluids, by mouth potassium, check BMP/magnesium in the morning  4 acute paresthesias with slurred speech Patient is neurologically  intact May be related to possible TIA Admitted on our TIA protocol, check MRI of  the brain for further evaluation, neuro checks per routine, echocardiogram, carotid Dopplers, physical therapy to evaluate/treat, aspirin, statin therapy-check lipids in the morning  5 chronic hypothyroidism Check TSH and continue levothyroxine  6 chronic benign essential hypertension Currently uncontrolled Acute CVA thought to be unlikely given normal physiologic paresthesias Continue Cozaar, start Lopressor twice a day, hydralazine when necessary systolic blood pressure greater than 180, vitals per routine, make changes as necessary  Full code Condition stable Disposition home when 1-2 days barring any complications   All the records are reviewed and case discussed with ED provider. Management plans discussed with the patient, family and they are in agreement.  CODE STATUS: Code Status History    This patient does not have a recorded code status. Please follow your organizational policy for patients in this situation.       TOTAL TIME TAKING CARE OF THIS PATIENT: 45 minutes.    Avel Peace Alika Eppes M.D on 10/02/2017   Between 7am to 6pm - Pager - [phone]  After 6pm go to [email or web address] - password [email or web address] [facility]  Office  [phone]  CC: Primary care physician; Glendon Axe, MD   Note: This dictation was prepared with Dragon dictation along with smaller phrase technology. Any transcriptional errors that result from this process are unintentional.

## 2017-10-02 NOTE — ED Notes (Signed)
Samantha RN, aware of bed assigned  

## 2017-10-03 ENCOUNTER — Inpatient Hospital Stay
Admit: 2017-10-03 | Discharge: 2017-10-03 | Disposition: A | Discharge status: Home / Self Care | Payer: Medicare Other | Attending: Family Medicine | Admitting: Family Medicine

## 2017-10-03 ENCOUNTER — Inpatient Hospital Stay: Payer: Medicare Other

## 2017-10-03 ENCOUNTER — Other Ambulatory Visit: Payer: Self-pay

## 2017-10-03 LAB — LIPID PANEL
Cholesterol: 194 mg/dL (ref 0–200)
HDL: 65 mg/dL (ref >40)
LDL Cholesterol: 113 mg/dL — ABNORMAL HIGH (ref 0–99)
Total CHOL/HDL Ratio: 3 RATIO
Triglycerides: 78 mg/dL (ref <150)
VLDL: 16 mg/dL (ref 0–40)

## 2017-10-03 LAB — HEMOGLOBIN A1C
HEMOGLOBIN A1C: 5.5 % (ref 4.8–5.6)
Mean Plasma Glucose: 111.15 mg/dL

## 2017-10-03 LAB — BASIC METABOLIC PANEL
Anion gap: 7 (ref 5–15)
BUN: 11 mg/dL (ref 6–20)
CALCIUM: 8.8 mg/dL — AB (ref 8.9–10.3)
CO2: 25 mmol/L (ref 22–32)
CREATININE: 0.65 mg/dL (ref 0.44–1.00)
Chloride: 105 mmol/L (ref 101–111)
GFR calc Af Amer: >60 mL/min (ref >60)
Glucose, Bld: 85 mg/dL (ref 65–99)
POTASSIUM: 3.7 mmol/L (ref 3.5–5.1)
SODIUM: 137 mmol/L (ref 135–145)

## 2017-10-03 LAB — GLUCOSE, CAPILLARY
GLUCOSE-CAPILLARY: 69 mg/dL (ref 65–99)
Glucose-Capillary: 135 mg/dL — ABNORMAL HIGH (ref 65–99)
Glucose-Capillary: 85 mg/dL (ref 65–99)
Glucose-Capillary: 96 mg/dL (ref 65–99)
Glucose-Capillary: 88 mg/dL (ref 65–99)

## 2017-10-03 LAB — MAGNESIUM: MAGNESIUM: 2 mg/dL (ref 1.7–2.4)

## 2017-10-03 MED ORDER — KCL IN DEXTROSE-NACL 20-5-0.9 MEQ/L-%-% IV SOLN
INTRAVENOUS | Status: DC
  Administered 2017-10-03 – 2017-10-04 (×2): via INTRAVENOUS
  Filled 2017-10-03 (×4): qty 1000

## 2017-10-03 MED ORDER — MORPHINE SULFATE (PF) 2 MG/ML IV SOLN
1.0000 mg | INTRAVENOUS | Status: DC | PRN
  Administered 2017-10-03: 1 mg via INTRAVENOUS
  Filled 2017-10-03: qty 1

## 2017-10-03 MED ORDER — DEXTROSE 50 % IV SOLN
25.0000 mL | Freq: Once | INTRAVENOUS | Status: AC
  Administered 2017-10-03: 25 mL via INTRAVENOUS
  Filled 2017-10-03: qty 50

## 2017-10-03 NOTE — Progress Notes (Signed)
[facility] NAME: Autumn Kerr    MR#:  [id number]  DATE OF BIRTH:  29-Dec-1940  SUBJECTIVE:  CHIEF COMPLAINT: Patient speech is slurry from June of  2018.  Has some vocal cord problem, denies any tingling in her lower extremities but reporting weakness  REVIEW OF SYSTEMS:  CONSTITUTIONAL: No fever, fatigue.  Reporting generalized weakness in her lower extremities  EYES: No blurred or double vision.  EARS, NOSE, AND THROAT: No tinnitus or ear pain.  RESPIRATORY: No cough, shortness of breath, wheezing or hemoptysis.  CARDIOVASCULAR: No chest pain, orthopnea, edema.  GASTROINTESTINAL: No nausea, vomiting, diarrhea or abdominal pain.  GENITOURINARY: No dysuria, hematuria.  ENDOCRINE: No polyuria, nocturia,  HEMATOLOGY: No anemia, easy bruising or bleeding SKIN: No rash or lesion. MUSCULOSKELETAL: No joint pain or arthritis.   NEUROLOGIC: No tingling, numbness, weakness.  PSYCHIATRY: No anxiety or depression.   DRUG ALLERGIES:   Allergies  Allergen Reactions  . Levothyroxine Other (See Comments)    intolerance  . Lisinopril Cough  . Prednisone     VITALS:  Blood pressure (!) 156/81, pulse 62, temperature 97.8 F (36.6 C), temperature source Oral, resp. rate 18, height 5' 4.5" (1.638 m), weight 76.7 kg (169 lb), SpO2 100 %.  PHYSICAL EXAMINATION:  GENERAL:  77 y.o.-year-old patient lying in the bed with no acute distress.  EYES: Pupils equal, round, reactive to light and accommodation. No scleral icterus. Extraocular muscles intact.  HEENT: Head atraumatic, normocephalic. Oropharynx and nasopharynx clear.  NECK:  Supple, no jugular venous distention. No thyroid enlargement, no tenderness.  LUNGS: Normal breath sounds bilaterally, no wheezing, rales,rhonchi or crepitation. No use of accessory muscles of respiration.  CARDIOVASCULAR: S1, S2 normal. No murmurs, rubs, or gallops.  ABDOMEN: Soft, nontender, nondistended.  Bowel sounds present. No organomegaly or mass.  EXTREMITIES: No pedal edema, cyanosis, or clubbing.  NEUROLOGIC: Cranial nerves II through XII are intact. Muscle strength 5/5 in all extremities. Sensation intact. Gait not checked.  PSYCHIATRIC: The patient is alert and oriented x 3.  SKIN: No obvious rash, lesion, or ulcer.    LABORATORY PANEL:   CBC Recent Labs  Lab 10/02/17 1618  WBC 7.0  HGB 13.9  HCT 40.0  PLT 291   ------------------------------------------------------------------------------------------------------------------  Chemistries  Recent Labs  Lab 10/03/17 0411  NA 137  K 3.7  CL 105  CO2 25  GLUCOSE 85  BUN 11  CREATININE 0.65  CALCIUM 8.8*  MG 2.0   ------------------------------------------------------------------------------------------------------------------  Cardiac Enzymes Recent Labs  Lab 09/28/17 1911  TROPONINI <0.03   ------------------------------------------------------------------------------------------------------------------  RADIOLOGY:  Dg Chest 2 View  Result Date: 10/02/2017 CLINICAL DATA:  Hypertension numbness and tingling EXAM: CHEST  2 VIEW COMPARISON:  None. FINDINGS: Diffuse bronchitic changes. No consolidation or effusion. Normal cardiomediastinal silhouette with aortic atherosclerosis. Negative for pneumothorax. IMPRESSION: Diffuse bronchitic changes.  No focal pulmonary opacity. Electronically Signed   By: Donavan Foil M.D.   On: 10/02/2017 23:06   Ct Head Wo Contrast  Result Date: 10/02/2017 CLINICAL DATA:  Slurred speech. Numbness and tingling. Subacute neurologic deficit. Hypertension. EXAM: CT HEAD WITHOUT CONTRAST TECHNIQUE: Contiguous axial images were obtained from the base of the skull through the vertex without intravenous contrast. COMPARISON:  None. FINDINGS: Brain: No evidence of acute infarction, hemorrhage, hydrocephalus, extra-axial collection, or mass lesion/mass effect. Vascular:  No hyperdense vessel  or other acute findings. Skull: No evidence of fracture or other significant bone abnormality. Sinuses/Orbits:  No acute  findings. Other: None. IMPRESSION: Negative noncontrast head CT. Electronically Signed   By: Earle Gell M.D.   On: 10/02/2017 17:09   US Carotid Bilateral (at [facility] Only)  Result Date: 10/03/2017 CLINICAL DATA:  77 year old female with a history of hyponatremia EXAM: BILATERAL CAROTID DUPLEX ULTRASOUND TECHNIQUE: Pearline Cables scale imaging, color Doppler and duplex ultrasound were performed of bilateral carotid and vertebral arteries in the neck. COMPARISON:  No prior duplex FINDINGS: Criteria: Quantification of carotid stenosis is based on velocity parameters that correlate the residual internal carotid diameter with NASCET-based stenosis levels, using the diameter of the distal internal carotid lumen as the denominator for stenosis measurement. The following velocity measurements were obtained: RIGHT ICA:  Systolic 423 cm/sec, Diastolic 15 cm/sec CCA:  82 cm/sec SYSTOLIC ICA/CCA RATIO:  1.6 ECA:  143 cm/sec LEFT ICA:  Systolic 97 cm/sec, Diastolic 26 cm/sec CCA:  91 cm/sec SYSTOLIC ICA/CCA RATIO:  1.1 ECA:  107 cm/sec Right Brachial SBP: Not acquired Left Brachial SBP: Not acquired RIGHT CAROTID ARTERY: No significant calcifications of the right common carotid artery. Intermediate waveform maintained. Heterogeneous and partially calcified plaque at the right carotid bifurcation. No significant lumen shadowing. Low resistance waveform of the right ICA. No significant tortuosity. RIGHT VERTEBRAL ARTERY: Antegrade flow with low resistance waveform. LEFT CAROTID ARTERY: No significant calcifications of the left common carotid artery. Intermediate waveform maintained. Heterogeneous and partially calcified plaque at the left carotid bifurcation without significant lumen shadowing. Low resistance waveform of the left ICA. No significant tortuosity. LEFT VERTEBRAL ARTERY:  Antegrade flow with low  resistance waveform. IMPRESSION: Color duplex indicates minimal heterogeneous and calcified plaque, with no hemodynamically significant stenosis by duplex criteria in the extracranial cerebrovascular circulation. Signed, Dulcy Fanny. Earleen Newport, DO [facility] Electronically Signed   By: Corrie Mckusick D.O.   On: 10/03/2017 09:04    EKG:   Orders placed or performed during the hospital encounter of 10/02/17  . ED EKG  . ED EKG  . EKG 12-Lead  . EKG 12-Lead    ASSESSMENT AND PLAN:     1 acute severe hyponatremia With IV fluids resolved Most likely secondary to poor by mouth intake Sodium is back to normal 122-137   2 acute possible urinary tract infection Rocephin IV for 3 course of follow-up on cultures  3 acute hypochloremia/hypokalemia Improved with IV fluids   4 acute paresthesias with slurred speech-possible TIA Patient is neurologically intact Requesting neurology consult check MRI of the brain for further evaluation  neuro checks per routine echocardiogram,  carotid Dopplers-no significant stenosis  physical therapy to evaluate/treat Aspirin  LDL 113 statin therapy-check lipids in the morning  5 chronic hypothyroidism nml TSH and continue levothyroxine  6 chronic benign essential hypertension Currently uncontrolled Acute CVA thought to be unlikely given normal physiologic paresthesias Continue Cozaar, patient is started on Lopressor twice a day titrate as needed, hydralazine when necessary systolic blood pressure greater than 180, vitals per routine, make changes as necessary      All the records are reviewed and case discussed with Care Management/Social Workerr. Management plans discussed with the patient, family and they are in agreement.  CODE STATUS:  fc   TOTAL TIME TAKING CARE OF THIS PATIENT: 34  minutes.   POSSIBLE D/C IN 1-2  DAYS, DEPENDING ON CLINICAL CONDITION.  Note: This dictation  was prepared with Dragon dictation along with smaller phrase technology. Any transcriptional errors that result from this process are unintentional.   Illene Silver  Keleigh Kazee M.D on 10/03/2017 at 1:48 PM  Between 7am to 6pm - Pager - [phone] After 6pm go to [email or web address] - password [email or web address] [facility]  [facility]  Office  [phone]  CC: Primary care physician; Glendon Axe, MD

## 2017-10-03 NOTE — Progress Notes (Signed)
Hypoglycemic Event  Time: 0830 CBG: 69  Treatment: 0848 67mL D50  Symptoms: none  Follow-up CBG: Time: 0925 CBG Result: 135  Possible Reasons for Event: NPO Comments/MD notified: Gouru made aware, see new orders.   Autumn Kerr

## 2017-10-03 NOTE — Progress Notes (Signed)
PT Cancellation Note  Patient Details Name: Autumn Kerr MRN: [id number] DOB: [birth year]   Cancelled Treatment:    Reason Eval/Treat Not Completed: Patient at procedure or test/unavailable(Consult received and chart reviewed.  Patient currently off unit for diagnostic testing.  Will re-attempt at later time/date as medically appropriate.)   [name] H. Owens Shark, PT, DPT, NCS 10/03/17, 10:28 AM [phone]

## 2017-10-03 NOTE — Progress Notes (Signed)
MD notified of swallow screen results, orders placed. Will continue to monitor.

## 2017-10-04 DIAGNOSIS — R202 Paresthesia of skin: Secondary | ICD-10-CM

## 2017-10-04 DIAGNOSIS — R4781 Slurred speech: Secondary | ICD-10-CM

## 2017-10-04 LAB — ECHOCARDIOGRAM COMPLETE
HEIGHTINCHES: 64.5 in
Weight: 2704 oz

## 2017-10-04 LAB — GLUCOSE, CAPILLARY
GLUCOSE-CAPILLARY: 89 mg/dL (ref 65–99)
GLUCOSE-CAPILLARY: 121 mg/dL — AB (ref 65–99)

## 2017-10-04 MED ORDER — ASPIRIN EC 325 MG PO TBEC: 325.0000 mg | DELAYED_RELEASE_TABLET | Freq: Every day | ORAL | 3 refills | Status: AC

## 2017-10-04 MED ORDER — ATORVASTATIN CALCIUM 20 MG PO TABS: 20.0000 mg | ORAL_TABLET | Freq: Every day | ORAL | 0 refills | Status: AC

## 2017-10-04 MED ORDER — ADULT MULTIVITAMIN W/MINERALS CH: 1.0000 | ORAL_TABLET | Freq: Every day | ORAL | Status: AC

## 2017-10-04 MED ORDER — CEPHALEXIN 500 MG PO CAPS: 500.0000 mg | ORAL_CAPSULE | Freq: Two times a day (BID) | ORAL | 0 refills | Status: AC

## 2017-10-04 MED ORDER — METOPROLOL TARTRATE 25 MG PO TABS: 25.0000 mg | ORAL_TABLET | Freq: Two times a day (BID) | ORAL | 0 refills | Status: AC

## 2017-10-04 MED ORDER — CEPHALEXIN 500 MG PO CAPS
500.0000 mg | ORAL_CAPSULE | Freq: Two times a day (BID) | ORAL | Status: DC
  Administered 2017-10-04: 13:00:00 500 mg via ORAL
  Filled 2017-10-04: qty 1

## 2017-10-04 NOTE — Progress Notes (Signed)
OT Cancellation Note  Patient Details Name: Autumn Kerr MRN: [id number] DOB: [birth year]   Cancelled Treatment:    Reason Eval/Treat Not Completed: Patient at procedure or test/ unavailable. Order received, chart reviewed, SLP entering room upon initial attempt. Will re-attempt OT evaluation at later time as pt is available and medically appropriate.  Jeni Salles, MPH, MS, OTR/L ascom [phone] 10/04/17, 9:16 AM

## 2017-10-04 NOTE — Care Management Important Message (Signed)
Important Message  Patient Details  Name: Autumn Kerr MRN: [id number] Date of Birth: Jan 26, 1941   Medicare Important Message Given:  Yes    Shelbie Ammons, RN 10/04/2017, 7:23 AM

## 2017-10-04 NOTE — Progress Notes (Signed)
Discharge instructions along with home medications and follow up gone over with patient and son. Both verbalize that they understood instructions. 3 prescriptions given to patient. IV and tele removed. Pt being discharged home on room air, no distress noted. Ammie Dalton, RN

## 2017-10-04 NOTE — Evaluation (Signed)
Physical Therapy Evaluation Patient Details Name: Autumn Kerr MRN: [id number] DOB: Oct 24, 1940 Today's Date: 10/04/2017   History of Present Illness  77yo female pt presented on 1/26 with complaints of paresthesias over her entire body dizziness and generalized weakness.  Also patient also reported slurred speech. Found to have hyponatremia and possible UTI.  CT head and MRI negative for acute change.  Clinical Impression  Upon evaluation, patient alert and oriented; follows commands and demonstrates good effort with all functional activities.  Speech generally slurred and "heavy/thick" at all times, but no word-finding, comprehension or expression deficits noted.  Mildly impulsive; intermittent cuing for safety awareness.  Bilat UE/LE strength grossly symmetrical and WFL.  Does demonstate mild coordination deficits R UE and endorses symmetrical paresthesias bilat mid-feet distally (improved since admission per patient report).  Able to complete bed mobility, sit/stand, basic transfers and gait (600') without assist device, close sup/mod indep.  Higher-level balance deficits noted with dynamic gait components (modified DGI 9/12), cga for balance correction. Would benefit from skilled PT to address above deficits and promote optimal return to PLOF; recommend transition to home with outpatient PT follow up to address higher-level balance deficits.    Follow Up Recommendations Outpatient PT    Equipment Recommendations       Recommendations for Other Services       Precautions / Restrictions Precautions Precautions: Fall Restrictions Weight Bearing Restrictions: No      Mobility  Bed Mobility Overal bed mobility: Modified Independent             General bed mobility comments: good effort, no LOB  Transfers Overall transfer level: Needs assistance   Transfers: Sit to/from Stand Sit to Stand: Modified independent (Device/Increase time)         General transfer comment: good  LE strength/stability; mod impulsivity with position changes and mobility  Ambulation/Gait Ambulation/Gait assistance: Supervision;Modified independent (Device/Increase time) Ambulation Distance (Feet): 600 Feet Assistive device: None       General Gait Details: reciprocal stepping pattern with mild increase in R lateral sway, R LE scissoring at times with inconsistent R > L step height/length (especially with head turns and dynamic gait components)  Stairs            Wheelchair Mobility    Modified Rankin (Stroke Patients Only)       Balance Overall balance assessment: Needs assistance Sitting-balance support: No upper extremity supported;Feet supported Sitting balance-Leahy Scale: Good     Standing balance support: No upper extremity supported Standing balance-Leahy Scale: Fair Standing balance comment: impulsive-very quick to move between seating surfaces/positions.  Able to reach >6" outside BOS (using contralateral UE stabilization as needed), retrieve item from floor without LOB               High Level Balance Comments: positive romberg testing, requiring cga/min assist for correction of sway/LOB during eyes-closed stance             Pertinent Vitals/Pain Pain Assessment: No/denies pain    Home Living Family/patient expects to be discharged to:: Private residence Living Arrangements: Children Available Help at Discharge: Family;Available PRN/intermittently Type of Home: House Home Access: Stairs to enter Entrance Stairs-Rails: Right;Left;Can reach both Entrance Stairs-Number of Steps: 4 Home Layout: One level Home Equipment: None Additional Comments: pt has been visiting son who lives in [place] (all home info obtained is son's home), pt verbalizes plan to return to apartment in [place] in April    Prior Function Level of Independence: Independent  Comments: Pt notes independent with mobility, ADL, IADL, and no falls in 12 months. Pt  notes that she doesn't have a car here and son is able to take her anywhere she needs to go. Her car is in [place] where she primarily lives.     Hand Dominance        Extremity/Trunk Assessment   Upper Extremity Assessment Upper Extremity Assessment: Overall WFL for tasks assessed(mild coordination R UE with finger/nose?  otherwise, symmetrical and WFL)    Lower Extremity Assessment Lower Extremity Assessment: Overall WFL for tasks assessed(grossly 4+/5 throughout; no focal weakness; negative babinski bilat.  Mild paresthesias bilat mid-feet distally (improved since admission))    Cervical / Trunk Assessment Cervical / Trunk Assessment: Normal  Communication   Communication: (slurred, "heavy" speech)  Cognition Arousal/Alertness: Awake/alert Behavior During Therapy: WFL for tasks assessed/performed;Impulsive Overall Cognitive Status: Within Functional Limits for tasks assessed                                        General Comments      Exercises Other Exercises Other Exercises: Toilet transfer, ambulatory without assist device, sup/mod indep; sit/stand from standard toilet, sup/mod indep; standing balance for clothing management, hand hygiene, close sup.  Does require UE support for functional activities requiring periods of SLS (i.e., stepping into undergarments)   Assessment/Plan    PT Assessment Patient needs continued PT services  PT Problem List Decreased activity tolerance;Decreased balance;Decreased mobility;Decreased coordination;Decreased knowledge of use of DME;Decreased safety awareness;Decreased knowledge of precautions       PT Treatment Interventions DME instruction;Gait training;Therapeutic exercise;Stair training;Functional mobility training;Therapeutic activities;Neuromuscular re-education;Balance training;Patient/family education    PT Goals (Current goals can be found in the Care Plan section)  Acute Rehab PT Goals Patient Stated Goal: to  return to son's house and then return home to [place] in April PT Goal Formulation: With patient Time For Goal Achievement: 10/18/17 Potential to Achieve Goals: Good    Frequency Min 2X/week   Barriers to discharge        Co-evaluation               AM-PAC PT "6 Clicks" Daily Activity  Outcome Measure Difficulty turning over in bed (including adjusting bedclothes, sheets and blankets)?: None Difficulty moving from lying on back to sitting on the side of the bed? : None Difficulty sitting down on and standing up from a chair with arms (e.g., wheelchair, bedside commode, etc,.)?: A Little Help needed moving to and from a bed to chair (including a wheelchair)?: A Little Help needed walking in hospital room?: A Little Help needed climbing 3-5 steps with a railing? : A Little 6 Click Score: 20    End of Session Equipment Utilized During Treatment: Gait belt Activity Tolerance: Patient tolerated treatment well Patient left: in bed;with call bell/phone within reach Nurse Communication: Mobility status PT Visit Diagnosis: Muscle weakness (generalized) (M62.81);Difficulty in walking, not elsewhere classified (R26.2)    Time: 0938-1829 PT Time Calculation (min) (ACUTE ONLY): 22 min   Charges:   PT Evaluation $PT Eval Moderate Complexity: 1 Mod PT Treatments $Therapeutic Activity: 8-22 mins   PT G Codes:        Tannar Broker H. Owens Shark, PT, DPT, NCS 10/04/17, 2:19 PM [phone]

## 2017-10-04 NOTE — Evaluation (Signed)
Occupational Therapy Evaluation Patient Details Name: Autumn Kerr MRN: [id number] DOB: 1941-02-18 Today's Date: 10/04/2017    History of Present Illness 77yo female pt presented on 1/26 with complaints of paresthesias over her entire body dizziness and generalized weakness.  Also patient also reported slurred speech. Found to have hyponatremia and possible UTI.    Clinical Impression   Pt seen for OT evaluation this date. Prior to admission, pt independent with mobility, ADL, and IADL tasks. Pt currently has been living with her son in [place], with plans to return to her apartment in [place] in April. Pt does not have a car here, but son is able to drive her anywhere she needs to go. Pt presents at baseline functional independence with ADL and mobility tasks. No visual/cognitive/strength/coordination deficits noted with testing. Pt endorses mild tingling in both feet still, but otherwise reports feeling back to baseline. 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 No additional skilled OT needs at this time. Will sign off. Please re-consult if additional needs arise.     Follow Up Recommendations  No OT follow up    Equipment Recommendations  None recommended by OT    Recommendations for Other Services       Precautions / Restrictions Precautions Precautions: Fall Restrictions Weight Bearing Restrictions: No      Mobility Bed Mobility Overal bed mobility: Independent             General bed mobility comments: good effort, no LOB  Transfers Overall transfer level: Independent               General transfer comment: good effort, no LOB    Balance Overall balance assessment: No apparent balance deficits (not formally assessed)                                         ADL either performed or assessed with clinical judgement   ADL Overall ADL's : At  baseline;Independent                                       General ADL Comments: no difficulty noted during assessment with ADL tasks     Vision Baseline Vision/History: Wears glasses Wears Glasses: At all times Patient Visual Report: No change from baseline Vision Assessment?: No apparent visual deficits     Perception     Praxis      Pertinent Vitals/Pain Pain Assessment: No/denies pain     Hand Dominance     Extremity/Trunk Assessment Upper Extremity Assessment Upper Extremity Assessment: Overall WFL for tasks assessed(intact sensation/strength/coordination bilaterally)   Lower Extremity Assessment Lower Extremity Assessment: Overall WFL for tasks assessed(intact strength/coordination bilaterally, pt notes some mild tingling in both feet still)   Cervical / Trunk Assessment Cervical / Trunk Assessment: Normal   Communication Communication Communication: No difficulties(mild slurred speech, pt notes due to scar tissue on vocal cords)   Cognition Arousal/Alertness: Awake/alert Behavior During Therapy: WFL for tasks assessed/performed Overall Cognitive Status: Within Functional Limits for tasks assessed                                     General Comments  Exercises     Shoulder Instructions      Home Living Family/patient expects to be discharged to:: Private residence Living Arrangements: Children(son) Available Help at Discharge: Family;Available PRN/intermittently(son works) Type of Home: House Home Access: Stairs to enter [employer] of Steps: 4 Entrance Stairs-Rails: Right;Left(cannot reach both) Home Layout: One level     Bathroom Shower/Tub: Walk-in shower;Tub/shower unit(uses tub shower)   Bathroom Toilet: Handicapped height     Home Equipment: None   Additional Comments: pt has been visiting son who lives in [place] (all home info obtained is son's home), pt verbalizes plan to return to  apartment in [place] in April      Prior Functioning/Environment Level of Independence: Independent        Comments: Pt notes independent with mobility, ADL, IADL, and no falls in 12 months. Pt notes that she doesn't have a car here and son is able to take her anywhere she needs to go. Her car is in [place] where she primarily lives.        OT Problem List:        OT Treatment/Interventions:      OT Goals(Current goals can be found in the care plan section) Acute Rehab OT Goals Patient Stated Goal: to return to son's house and then return home to [place] in April OT Goal Formulation: All assessment and education complete, DC therapy  OT Frequency:     Barriers to D/C:            Co-evaluation              AM-PAC PT "6 Clicks" Daily Activity     Outcome Measure Help from another person eating meals?: None Help from another person taking care of personal grooming?: None Help from another person toileting, which includes using toliet, bedpan, or urinal?: None Help from another person bathing (including washing, rinsing, drying)?: None Help from another person to put on and taking off regular upper body clothing?: None Help from another person to put on and taking off regular lower body clothing?: None 6 Click Score: 24   End of Session    Activity Tolerance: Patient tolerated treatment well Patient left: in bed;with call bell/phone within reach;with bed alarm set  OT Visit Diagnosis: Other abnormalities of gait and mobility (R26.89)                Time: 4097-3532 OT Time Calculation (min): 13 min Charges:  OT General Charges $OT Visit: 1 Visit OT Evaluation $OT Eval Low Complexity: 1 Low  Jeni Salles, MPH, MS, OTR/L ascom [phone] 10/04/17, 12:02 PM

## 2017-10-04 NOTE — Consult Note (Signed)
Reason for Consult:Tingling, slurred speech Referring Physician: Gouru  CC: Tingling, slurred speech  HPI: Autumn Kerr is an 77 y.o. female who presented on 126 with complaints of paresthesias over her entire body dizziness and generalized weakness.  Also patient also reported slurred speech at that time.  Patient is noted to have metabolic issues.  These have been addressed and her paresthesias are improved.  She continues to have slurred speech.  She reports that she has had slurred speech since June of last year.  She reports that the slurred speech was of gradual onset and worsened over the course of a month.  After a month's time it seemed to plateau.  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  This was due to reflux.  She is being treated for her reflux.  Past Medical History:  Diagnosis Date  . Anxiety   . Arthritis   . Cancer ([place])    malignant melanoma of skin  . Diabetes mellitus without complication ([place])   . GERD (gastroesophageal reflux disease)   . Hypercholesterolemia   . Hyperlipemia   . Hypertension   . Neuropathy   . Osteoarthritis   . Osteopenia   . Thyroid disease   . Vitamin D deficiency   . Vocal cord dysfunction     Past Surgical History:  Procedure Laterality Date  . EYE SURGERY Right    cataract    History reviewed. No pertinent family history.  Social History:  reports that  has never smoked. she has never used smokeless tobacco. She reports that she drinks alcohol. She reports that she does not use drugs.  Allergies  Allergen Reactions  . Levothyroxine Other (See Comments)    intolerance  . Lisinopril Cough  . Prednisone     Medications:  I have reviewed the patient's current medications. Prior to Admission:  Medications Prior to Admission  Medication Sig Dispense Refill Last Dose  . losartan (COZAAR) 25 MG tablet Take 3 tablets (75 mg total) by mouth daily. (Patient taking differently: Take 25 mg by mouth  daily. ) 30 tablet 1 10/02/2017 at 0800  . ranitidine (ZANTAC) 150 MG tablet Take 150 mg by mouth 2 (two) times daily.   10/02/2017 at 0800  . TIROSINT 125 MCG CAPS Take 125 mcg by mouth daily.   10/02/2017 at 0800   Scheduled: . atorvastatin  20 mg Oral q1800  . enoxaparin (LOVENOX) injection  40 mg Subcutaneous Q24H  . famotidine  20 mg Oral Daily  . insulin aspart  0-15 Units Subcutaneous TID WC  . insulin aspart  0-5 Units Subcutaneous QHS  . levothyroxine  125 mcg Oral QAC breakfast  . losartan  75 mg Oral Daily  . multivitamin with minerals  1 tablet Oral Daily    ROS: History obtained from the patient  General ROS: negative for - chills, fatigue, fever, night sweats, weight gain or weight loss Psychological ROS: negative for - behavioral disorder, hallucinations, memory difficulties, mood swings or suicidal ideation Ophthalmic ROS: negative for - blurry vision, double vision, eye pain or loss of vision ENT ROS: dizziness Allergy and Immunology ROS: negative for - hives or itchy/watery eyes Hematological and Lymphatic ROS: negative for - bleeding problems, bruising or swollen lymph nodes Endocrine ROS: negative for - galactorrhea, hair pattern changes, polydipsia/polyuria or temperature intolerance Respiratory ROS: negative for - cough, hemoptysis, shortness of breath or wheezing Cardiovascular ROS: negative for - chest pain, dyspnea on exertion, edema or irregular heartbeat  Gastrointestinal ROS: negative for - abdominal pain, diarrhea, hematemesis, nausea/vomiting or stool incontinence Genito-Urinary ROS: negative for - dysuria, hematuria, incontinence or urinary frequency/urgency Musculoskeletal ROS: generalized weakness Neurological ROS: as noted in HPI Dermatological ROS: negative for rash and skin lesion changes  Physical Examination: Blood pressure (!) 143/58, pulse (!) 58, temperature 97.7 F (36.5 C), resp. rate 14, height 5' 4.5" (1.638 m), weight 76.7 kg (169 lb), SpO2  96 %.  HEENT-  Normocephalic, no lesions, without obvious abnormality.  Normal external eye and conjunctiva.  Normal TM's bilaterally.  Normal auditory canals and external ears. Normal external nose, mucus membranes and septum.  Normal pharynx. Cardiovascular- S1, S2 normal, pulses palpable throughout   Lungs- chest clear, no wheezing, rales, normal symmetric air entry Abdomen- soft, non-tender; bowel sounds normal; no masses,  no organomegaly Extremities- mild LE edema Lymph-no adenopathy palpable Musculoskeletal-no joint tenderness, deformity or swelling Skin-warm and dry, no hyperpigmentation, vitiligo, or suspicious lesions  Neurological Examination   Mental Status: Alert, oriented, thought content appropriate.  Speech fluent without evidence of aphasia.  Able to repeat tongue twisters.  Able to follow 3 step commands without difficulty. Cranial Nerves: II: Discs flat bilaterally; Visual fields grossly normal, pupils equal, round, reactive to light and accommodation III,IV, VI: ptosis not present, extra-ocular motions intact bilaterally V,VII: smile symmetric, facial light touch sensation normal bilaterally VIII: hearing normal bilaterally IX,X: gag reflex present XI: bilateral shoulder shrug XII: midline tongue extension Motor: Right : Upper extremity   5/5    Left:     Upper extremity   5/5  Lower extremity   5/5     Lower extremity   5/5 Tone and bulk:normal tone throughout; no atrophy noted Sensory: Pinprick and light touch intact throughout, bilaterally Deep Tendon Reflexes: 1+ and symmetric with absent AJ's bilaterally Plantars: Right: mute   Left:mute Cerebellar: Normal finger-to-nose and normal heel-to-shin testing bilaterally Gait: not tested due to safety concerns    Laboratory Studies:   Basic Metabolic Panel: Recent Labs  Lab 09/28/17 1911 10/02/17 1618 10/03/17 0411  NA 134* 122* 137  K 3.3* 3.4* 3.7  CL 99* 89* 105  CO2 25 25 25   GLUCOSE 100* 99 85   BUN 14 12 11   CREATININE 0.69 0.75 0.65  CALCIUM 9.0 8.9 8.8*  MG  --   --  2.0    Liver Function Tests: No results for input(s): AST, ALT, ALKPHOS, BILITOT, PROT, ALBUMIN in the last 168 hours. No results for input(s): LIPASE, AMYLASE in the last 168 hours. No results for input(s): AMMONIA in the last 168 hours.  CBC: Recent Labs  Lab 09/28/17 1911 10/02/17 1618  WBC 9.6 7.0  HGB 14.0 13.9  HCT 41.1 40.0  MCV 90.4 89.2  PLT 278 291    Cardiac Enzymes: Recent Labs  Lab 09/28/17 1911  TROPONINI <0.03    BNP: Invalid input(s): POCBNP  CBG: Recent Labs  Lab 10/03/17 0925 10/03/17 1221 10/03/17 1648 10/03/17 2056 10/04/17 0732  GLUCAP 135* 85 96 88 87    Microbiology: Results for orders placed or performed during the hospital encounter of 10/02/17  Urine Culture     Status: Abnormal (Preliminary result)   Collection Time: 10/02/17  4:18 PM  Result Value Ref Range Status   Specimen Description   Final    URINE, RANDOM Performed at [facility], [address] [place], [place] [address]    Special Requests   Final    NONE Performed at [facility],  [place], [place] [address]    Culture >=100,000 COLONIES/mL GRAM NEGATIVE RODS (A)  Final   Report Status PENDING  Incomplete    Coagulation Studies: No results for input(s): LABPROT, INR in the last 72 hours.  Urinalysis:  Recent Labs  Lab 10/02/17 1618  COLORURINE YELLOW*  LABSPEC 1.002*  PHURINE 6.0  GLUCOSEU NEGATIVE  HGBUR NEGATIVE  BILIRUBINUR NEGATIVE  KETONESUR NEGATIVE  PROTEINUR NEGATIVE  NITRITE POSITIVE*  LEUKOCYTESUR SMALL*    Lipid Panel:     Component Value Date/Time   CHOL 194 10/03/2017 0411   TRIG 78 10/03/2017 0411   HDL 65 10/03/2017 0411   CHOLHDL 3.0 10/03/2017 0411   VLDL 16 10/03/2017 0411   LDLCALC 113 (H) 10/03/2017 0411    HgbA1C:  Lab Results  Component Value Date   HGBA1C 5.5 10/03/2017    Urine Drug Screen:       Component Value Date/Time   LABOPIA NONE DETECTED 10/02/2017 1618   COCAINSCRNUR NONE DETECTED 10/02/2017 1618   LABBENZ NONE DETECTED 10/02/2017 1618   AMPHETMU NONE DETECTED 10/02/2017 1618   THCU NONE DETECTED 10/02/2017 1618   LABBARB NONE DETECTED 10/02/2017 1618    Alcohol Level: No results for input(s): ETH in the last 168 hours.  Other results: EKG: sinus rhythm at 63 bpm.  Imaging: Dg Chest 2 View  Result Date: 10/02/2017 CLINICAL DATA:  Hypertension numbness and tingling EXAM: CHEST  2 VIEW COMPARISON:  None. FINDINGS: Diffuse bronchitic changes. No consolidation or effusion. Normal cardiomediastinal silhouette with aortic atherosclerosis. Negative for pneumothorax. IMPRESSION: Diffuse bronchitic changes.  No focal pulmonary opacity. Electronically Signed   By: Donavan Foil M.D.   On: 10/02/2017 23:06   Ct Head Wo Contrast  Result Date: 10/02/2017 CLINICAL DATA:  Slurred speech. Numbness and tingling. Subacute neurologic deficit. Hypertension. EXAM: CT HEAD WITHOUT CONTRAST TECHNIQUE: Contiguous axial images were obtained from the base of the skull through the vertex without intravenous contrast. COMPARISON:  None. FINDINGS: Brain: No evidence of acute infarction, hemorrhage, hydrocephalus, extra-axial collection, or mass lesion/mass effect. Vascular:  No hyperdense vessel or other acute findings. Skull: No evidence of fracture or other significant bone abnormality. Sinuses/Orbits:  No acute findings. Other: None. IMPRESSION: Negative noncontrast head CT. Electronically Signed   By: Earle Gell M.D.   On: 10/02/2017 17:09   Mr Brain Wo Contrast  Result Date: 10/03/2017 CLINICAL DATA:  Facial paresthesia.  Side not specified. EXAM: MRI HEAD WITHOUT CONTRAST MRA HEAD WITHOUT CONTRAST TECHNIQUE: Multiplanar, multiecho pulse sequences of the brain and surrounding structures were obtained without intravenous contrast. Angiographic images of the head were obtained using MRA technique  without contrast. COMPARISON:  Head CT from yesterday FINDINGS: MRI HEAD FINDINGS Brain: No explanation for symptoms. Unremarkable appearance of the brainstem, cisterns, Meckel's cave, and skull base. No acute infarct, hemorrhage, hydrocephalus, or masslike finding. Few FLAIR hyperintensities in the cerebral white matter, age normal. Vascular: Major flow voids are preserved. Skull and upper cervical spine: Negative for marrow lesion. Sinuses/Orbits: No acute finding. Congested appearance of the nasal mucosa. Right cataract resection. MRA HEAD FINDINGS Symmetric carotid arteries. Dominant right vertebral artery with most of the left vertebral artery flow into the pica. There is atherosclerotic type undulation of the vertebral and basilar arteries. High-grade bilateral proximal P2 segment stenoses followed by diffuse atheromatous irregularity and poor signal. Less extensive atheromatous changes in the anterior circulation, although there is mild to moderate right M1 and left M2 branch segment stenosis. Negative for aneurysm  or signs of vascular malformation. IMPRESSION: No acute finding, brain MRI: Age normal appearance.  No explanation for facial paresthesia. Intracranial MRA: 1. No acute finding. 2. 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 3. Less advanced atheromatous changes in the anterior circulation, most notably mild to moderate right M1 and left M2 branch stenosis. Electronically Signed   By: Monte Fantasia M.D.   On: 10/03/2017 20:30   US Carotid Bilateral (at [facility] Only)  Result Date: 10/03/2017 CLINICAL DATA:  77 year old female with a history of hyponatremia EXAM: BILATERAL CAROTID DUPLEX ULTRASOUND TECHNIQUE: Pearline Cables scale imaging, color Doppler and duplex ultrasound were performed of bilateral carotid and vertebral arteries in the neck. COMPARISON:  No prior duplex FINDINGS: Criteria: Quantification of carotid stenosis is based on velocity parameters that correlate the  residual internal carotid diameter with NASCET-based stenosis levels, using the diameter of the distal internal carotid lumen as the denominator for stenosis measurement. The following velocity measurements were obtained: RIGHT ICA:  Systolic 409 cm/sec, Diastolic 15 cm/sec CCA:  82 cm/sec SYSTOLIC ICA/CCA RATIO:  1.6 ECA:  143 cm/sec LEFT ICA:  Systolic 97 cm/sec, Diastolic 26 cm/sec CCA:  91 cm/sec SYSTOLIC ICA/CCA RATIO:  1.1 ECA:  107 cm/sec Right Brachial SBP: Not acquired Left Brachial SBP: Not acquired RIGHT CAROTID ARTERY: No significant calcifications of the right common carotid artery. Intermediate waveform maintained. Heterogeneous and partially calcified plaque at the right carotid bifurcation. No significant lumen shadowing. Low resistance waveform of the right ICA. No significant tortuosity. RIGHT VERTEBRAL ARTERY: Antegrade flow with low resistance waveform. LEFT CAROTID ARTERY: No significant calcifications of the left common carotid artery. Intermediate waveform maintained. Heterogeneous and partially calcified plaque at the left carotid bifurcation without significant lumen shadowing. Low resistance waveform of the left ICA. No significant tortuosity. LEFT VERTEBRAL ARTERY:  Antegrade flow with low resistance waveform. IMPRESSION: Color duplex indicates minimal heterogeneous and calcified plaque, with no hemodynamically significant stenosis by duplex criteria in the extracranial cerebrovascular circulation. Signed, Dulcy Fanny. Earleen Newport, DO [facility] Electronically Signed   By: Corrie Mckusick D.O.   On: 10/03/2017 09:04   Mr Jodene Nam Head/brain WJ Cm  Result Date: 10/03/2017 CLINICAL DATA:  Facial paresthesia.  Side not specified. EXAM: MRI HEAD WITHOUT CONTRAST MRA HEAD WITHOUT CONTRAST TECHNIQUE: Multiplanar, multiecho pulse sequences of the brain and surrounding structures were obtained without intravenous contrast. Angiographic images of the  head were obtained using MRA technique without contrast. COMPARISON:  Head CT from yesterday FINDINGS: MRI HEAD FINDINGS Brain: No explanation for symptoms. Unremarkable appearance of the brainstem, cisterns, Meckel's cave, and skull base. No acute infarct, hemorrhage, hydrocephalus, or masslike finding. Few FLAIR hyperintensities in the cerebral white matter, age normal. Vascular: Major flow voids are preserved. Skull and upper cervical spine: Negative for marrow lesion. Sinuses/Orbits: No acute finding. Congested appearance of the nasal mucosa. Right cataract resection. MRA HEAD FINDINGS Symmetric carotid arteries. Dominant right vertebral artery with most of the left vertebral artery flow into the pica. There is atherosclerotic type undulation of the vertebral and basilar arteries. High-grade bilateral proximal P2 segment stenoses followed by diffuse atheromatous irregularity and poor signal. Less extensive atheromatous changes in the anterior circulation, although there is mild to moderate right M1 and left M2 branch segment stenosis. Negative for aneurysm or signs of vascular malformation. IMPRESSION: No acute finding, brain MRI: Age normal appearance.  No explanation for facial paresthesia. Intracranial MRA: 1. No acute finding. 2. Advanced atheromatous type stenosis  of the bilateral P2 segments. 3. Less advanced atheromatous changes in the anterior circulation, most notably mild to moderate right M1 and left M2 branch stenosis. Electronically Signed   By: Monte Fantasia M.D.   On: 10/03/2017 20:30     Assessment/Plan: 77 year old female presenting with paresthesias.  Patient noted to be hyponatremic and hypokalemic.  Also noted to have a urinary tract infection.  Paresthesias improved today.  Does have issues with speech.  Reports that these date back to June of this year and were gradual in onset.  History does not suggest an ischemic etiology.  Although there have been some questions of whether or not  she has an apraxia this does not seem to be the case on examination either.  MRI of the brain reviewed and shows no acute changes.  Although there is some atrophy noted there does not appear to be in the etiology for her slurred speech on imaging.  MRA shows diffuse atherosclerosis.  But she has had a GI workup in the past that has revealed some vocal cord abnormalities.  Suspect this is the etiology of her speech issues.  Recommendations: 1.  Aspirin enteric-coated 325 mg daily.   2. Patient to follow-up with her physicians when she returns home on an outpatient basis. 3. No further neurologic intervention is recommended at this time.  If further questions arise, please call or page at that time.  Thank you for allowing neurology to participate in the care of this patient.   Alexis Goodell, MD Neurology [phone] 10/04/2017, 10:22 AM

## 2017-10-04 NOTE — Evaluation (Addendum)
Clinical/Bedside Swallow Evaluation Patient Details  Name: Maribel Hadley MRN: [id number] Date of Birth: 1940-09-27  Today's Date: 10/04/2017 Time: SLP Start Time (ACUTE ONLY): 0845 SLP Stop Time (ACUTE ONLY): 0945 SLP Time Calculation (min) (ACUTE ONLY): 60 min  Past Medical History:  Past Medical History:  Diagnosis Date  . Anxiety   . Arthritis   . Cancer ([place])    malignant melanoma of skin  . Diabetes mellitus without complication ([place])   . GERD (gastroesophageal reflux disease)   . Hypercholesterolemia   . Hyperlipemia   . Hypertension   . Neuropathy   . Osteoarthritis   . Osteopenia   . Thyroid disease   . Vitamin D deficiency   . Vocal cord dysfunction    Past Surgical History:  Past Surgical History:  Procedure Laterality Date  . EYE SURGERY Right    cataract   HPI:  Pt is a 77 y.o. female who presents to the emergency department today because of concern for weakness and tingling. She states that she has been feeling increasingly weak. She has also had associated tingling of her lower extremities. The patient states that this problem has been going on for a long time, however it has gotten worse the past day. She has also had lower abdominal pain. When she was in the emergency department four days ago she was told to increase her losartan dose, however she only did that one day because she felt like it dropped her blood pressure too low. Pt has a h/o GERD, DM, Ca, anxiety, vocal cord dysfunction, OA, HTN, neuropathy, Vit. D def. Pt has a h/o change/decline in her articulation of her speech since June 2018 per chart notes and her report. Pt stated her family has difficulty understanding her at times and that she "texts more w/ them now" because of it. Pt denies any receptive or expressive language decline (no receptive/expressive Aphasia appeared apparent during the BSE). Pt appeared to exhibit min Apraxic-like behaviors during assessment: slower rate, deliberate speech, lingual  movements during rapid, coordinated volitional tasks were slow and imprecise. Articulation was reduced w/ certain speech sounds. Pt does have a vocal cord dysfunction dx but no further details re: this.    Assessment / Plan / Recommendation Clinical Impression  Pt appears to present w/ adequate oropharyngeal phase swallow function w/ reduced risk for aspiration from an oropharyngeal phase standpoint. Pt would benefit from following general aspiration precautions. Oral phase appeared grossly wfl for bolus management and clearing. No unilateral OM weakness noted during bolus manipulation. Pharyngeal phase appeared wfl w/ no overt s/s of aspiration noted during/post trials(vocal quality clear, no coughing or throat clearing). Pt fed self; encouraged pt to take small bites/sips, slowly. Frequent belching noted post trials. Discussed general aspiration precautions. Recommend a regular diet consistency w/ thin liquids; general aspiration precautions. Recommend pills in puree if needed for easier swallowing. Recommend general REFLUX precautions and ongoing f/u w/ GI as pt does have baseline GERD per her report; on PPI. In regard to pt's c/o decline in her articulation of speech causing her deficits in communicating verbally w/ her family and slow motor movements in rapid/alternating movements/coordination, recommend f/u Neurology post discharge for ongoing education/assessment. MD/ NSG updated. SLP Visit Diagnosis: Dysphagia, unspecified (R13.10)    Aspiration Risk  (reduced )    Diet Recommendation  Regular diet w/ thin liquids; general aspiration precautions; REFLUX precautions  Medication Administration: Whole meds with puree(if easier for swallowing/clearing)    Other  Recommendations Recommended Consults: (Neurology consult  for further assessment/education) Oral Care Recommendations: Oral care BID;Patient independent with oral care Other Recommendations: (n/a)   Follow up Recommendations None       Frequency and Duration (n/a)  (n/a)       Prognosis Prognosis for Safe Diet Advancement: Good      Swallow Study   General Date of Onset: 10/02/17 HPI: Pt is a 77 y.o. female who presents to the emergency department today because of concern for weakness and tingling. She states that she has been feeling increasingly weak. She has also had associated tingling of her lower extremities. The patient states that this problem has been going on for a long time, however it has gotten worse the past day. She has also had lower abdominal pain. When she was in the emergency department four days ago she was told to increase her losartan dose, however she only did that one day because she felt like it dropped her blood pressure too low. Pt has a h/o GERD, DM, Ca, anxiety, vocal cord dysfunction, OA, HTN, neuropathy, Vit. D def. Pt has a h/o change/decline in her articulation of her speech since June 2018 per chart notes and her report. Pt stated her family has difficulty understanding her at times and that she "texts more w/ them now" because of it. Pt denies any receptive or expressive language decline (no receptive/expressive Aphasia appeared apparent during the BSE). Pt appeared to exhibit min Apraxic-like behaviors during assessment: slower rate, deliberate speech, lingual movements during rapid, coordinated volitional tasks were slow and imprecise. Articulation was reduced w/ certain speech sounds. Pt does have a vocal cord dysfunction dx but no further details re: this.  Type of Study: Bedside Swallow Evaluation Previous Swallow Assessment: none Diet Prior to this Study: Regular;Thin liquids(at home per pt report; currently NPO) Temperature Spikes Noted: No(wbc 7.0) Respiratory Status: Room air History of Recent Intubation: No Behavior/Cognition: Alert;Cooperative;Pleasant mood Oral Cavity Assessment: Within Functional Limits Oral Care Completed by SLP: Recent completion by staff Oral Cavity -  Dentition: Adequate natural dentition Vision: Functional for self-feeding Self-Feeding Abilities: Able to feed self Patient Positioning: Upright in bed Baseline Vocal Quality: Normal(min slower, deliberate rate of speech) Volitional Cough: Strong Volitional Swallow: Able to elicit    Oral/Motor/Sensory Function Overall Oral Motor/Sensory Function: Within functional limits(no unilateral weakness, ROM wfl. Rate/coordination min.)   Ice Chips Ice chips: Within functional limits Presentation: Spoon(3 trials)   Thin Liquid Thin Liquid: Within functional limits Presentation: Cup;Self Fed(~4 ozs total)    Nectar Thick Nectar Thick Liquid: Not tested   Honey Thick Honey Thick Liquid: Not tested   Puree Puree: Within functional limits Presentation: Self Fed;Spoon(4 ozs)   Solid   GO   Solid: Within functional limits Presentation: Self Fed;Spoon(8 trials)         Orinda Kenner, MS, CCC-SLP Watson,Katherine 10/04/2017,4:52 PM

## 2017-10-04 NOTE — Discharge Summary (Signed)
[place] [facility] [place] NAME: Autumn Kerr    MR#:  [id number]  DATE OF BIRTH:  23-May-1941  DATE OF ADMISSION:  10/02/2017 ADMITTING PHYSICIAN: Gorden Harms, MD  DATE OF DISCHARGE:  10/04/17  PRIMARY CARE PHYSICIAN: Glendon Axe, MD    ADMISSION DIAGNOSIS:  Lower urinary tract infectious disease [N39.0] Hyponatremia [E87.1] Paresthesias [R20.2] Weakness [R53.1]  DISCHARGE DIAGNOSIS:  Active Problems:   Hyponatremia syndrome  uti    SECONDARY DIAGNOSIS:   Past Medical History:  Diagnosis Date  . Anxiety   . Arthritis   . Cancer ([place])    malignant melanoma of skin  . Diabetes mellitus without complication ([place])   . GERD (gastroesophageal reflux disease)   . Hypercholesterolemia   . Hyperlipemia   . Hypertension   . Neuropathy   . Osteoarthritis   . Osteopenia   . Thyroid disease   . Vitamin D deficiency   . Vocal cord dysfunction     HOSPITAL COURSE:   HISTORY OF PRESENT ILLNESS: Autumn Kerr  is a 77 y.o. female with a known history below presenting with acute pins and needles all over her entire body, dizziness, slurred speech, generalized weakness all over, was concern for high blood pressure holding, emergency room patient was found to have urinalysis suspicious for UTI, hyponatremia of 122, chloride 89, potassium 3.4, patient to the emergency room, no distress, no neurological deficits noted, patient is now being admitted for acute severe hyponatremia, probable acute urinary tract infection, and paresthesias  1acute severe hyponatremia With IV fluids resolved Most likely secondary topoorby mouth intake Sodium is back to normal 122-137   2acute possible urinary tract infection Rocephin IV is given during the hospital course follow-up on cultures with the greater than 100,000 colonies of gram-negative rods.  Primary care physician to follow-up on the sensitivity results.  Discharge patient with p.o.  Keflex  3acute hypochloremia/hypokalemia Improved with IV fluids   4acute paresthesias with slurred speech-possible TIA Patient is neurologically intact Patient seen by neurology and recommending 325 mg of aspirin and no other interventions at this time MRI of the brain is negative  neuro checks per routine echocardiogram ejection fraction 55-60%.  No regional wall motion abnormalities.  No cardiac source of emboli were identified.  carotid Dopplers-no significant stenosis  physical therapy to evaluate/treat Aspirin  LDL 113 statin therapy-check lipids in the morning  5chronic hypothyroidism nml TSH and continue levothyroxine  6chronic benign essential hypertension better Continue Cozaar, patient is started on Lopressor twice a day titrate as needed,   Physical therapy has recommended outpatient PT, but patient currently lives in [place] and is trying to relocate back to [facility] and refusing outpatient PT at this time   DISCHARGE CONDITIONS:   stable  CONSULTS OBTAINED:  Treatment Team:  Leotis Pain, MD   PROCEDURES  None   DRUG ALLERGIES:   Allergies  Allergen Reactions  . Levothyroxine Other (See Comments)    intolerance  . Lisinopril Cough  . Prednisone     DISCHARGE MEDICATIONS:   Allergies as of 10/04/2017      Reactions   Levothyroxine Other (See Comments)   intolerance   Lisinopril Cough   Prednisone       Medication List    TAKE these medications   aspirin EC 325 MG tablet Take 1 tablet (325 mg total) by mouth daily.   atorvastatin 20 MG tablet Commonly known as:  LIPITOR Take 1 tablet (20 mg  total) by mouth daily at 6 PM.   cephALEXin 500 MG capsule Commonly known as:  KEFLEX Take 1 capsule (500 mg total) by mouth every 12 (twelve) hours for 6 doses.   losartan 25 MG tablet Commonly known as:  COZAAR Take 3 tablets (75 mg total) by mouth daily. What changed:  how much to take   metoprolol tartrate 25 MG  tablet Commonly known as:  LOPRESSOR Take 1 tablet (25 mg total) by mouth 2 (two) times daily.   multivitamin with minerals Tabs tablet Take 1 tablet by mouth daily. Start taking on:  10/05/2017   ranitidine 150 MG tablet Commonly known as:  ZANTAC Take 150 mg by mouth 2 (two) times daily.   TIROSINT 125 MCG Caps Generic drug:  Levothyroxine Sodium Take 125 mcg by mouth daily.        DISCHARGE INSTRUCTIONS:   Follow-up with primary care physician in 1 week   DIET:  Cardiac diet  DISCHARGE CONDITION:  Stable  ACTIVITY:  Activity as tolerated  OXYGEN:  Home Oxygen: No.   Oxygen Delivery: room air  DISCHARGE LOCATION:  home   If you experience worsening of your admission symptoms, develop shortness of breath, life threatening emergency, suicidal or homicidal thoughts you must seek medical attention immediately by calling 911 or calling your MD immediately  if symptoms less severe.  You Must read complete instructions/literature along with all the possible adverse reactions/side effects for all the Medicines you take and that have been prescribed to you. Take any new Medicines after you have completely understood and accpet all the possible adverse reactions/side effects.   Please note  You were cared for by a hospitalist during your hospital stay. If you have any questions about your discharge medications or the care you received while you were in the hospital after you are discharged, you can call the unit and asked to speak with the hospitalist on call if the hospitalist that took care of you is not available. Once you are discharged, your primary care physician will handle any further medical issues. Please note that NO REFILLS for any discharge medications will be authorized once you are discharged, as it is imperative that you return to your primary care physician (or establish a relationship with a primary care physician if you do not have one) for your aftercare  needs so that they can reassess your need for medications and monitor your lab values.     Today  Chief Complaint  Patient presents with  . Weakness  . Tingling   Patient is doing fine.  Tingling and paresthesias improved.  Has chronic slurry speech  ROS:  CONSTITUTIONAL: Denies fevers, chills. Denies any fatigue, weakness.  EYES: Denies blurry vision, double vision, eye pain. EARS, NOSE, THROAT: Denies tinnitus, ear pain, hearing loss. RESPIRATORY: Denies cough, wheeze, shortness of breath.  CARDIOVASCULAR: Denies chest pain, palpitations, edema.  GASTROINTESTINAL: Denies nausea, vomiting, diarrhea, abdominal pain. Denies bright red blood per rectum. GENITOURINARY: Denies dysuria, hematuria. ENDOCRINE: Denies nocturia or thyroid problems. HEMATOLOGIC AND LYMPHATIC: Denies easy bruising or bleeding. SKIN: Denies rash or lesion. MUSCULOSKELETAL: Denies pain in neck, back, shoulder, knees, hips or arthritic symptoms.  NEUROLOGIC: Denies paralysis, paresthesias.  PSYCHIATRIC: Denies anxiety or depressive symptoms.   VITAL SIGNS:  Blood pressure (!) 142/64, pulse 73, temperature 98.1 F (36.7 C), temperature source Oral, resp. rate 16, height 5' 4.5" (1.638 m), weight 76.7 kg (169 lb), SpO2 98 %.  I/O:    Intake/Output Summary (Last 24  hours) at 10/04/2017 1403 Last data filed at 10/03/2017 1753 Gross per 24 hour  Intake 451.25 ml  Output -  Net 451.25 ml    PHYSICAL EXAMINATION:  GENERAL:  77 y.o.-year-old patient lying in the bed with no acute distress.  EYES: Pupils equal, round, reactive to light and accommodation. No scleral icterus. Extraocular muscles intact.  HEENT: Head atraumatic, normocephalic. Oropharynx and nasopharynx clear.  NECK:  Supple, no jugular venous distention. No thyroid enlargement, no tenderness.  LUNGS: Normal breath sounds bilaterally, no wheezing, rales,rhonchi or crepitation. No use of accessory muscles of respiration.  CARDIOVASCULAR: S1, S2  normal. No murmurs, rubs, or gallops.  ABDOMEN: Soft, non-tender, non-distended. Bowel sounds present. No organomegaly or mass.  EXTREMITIES: No pedal edema, cyanosis, or clubbing.  NEUROLOGIC: Cranial nerves II through XII are intact. Muscle strength 5/5 in all extremities. Sensation intact. Gait not checked.  PSYCHIATRIC: The patient is alert and oriented x 3.  SKIN: No obvious rash, lesion, or ulcer.   DATA REVIEW:   CBC Recent Labs  Lab 10/02/17 1618  WBC 7.0  HGB 13.9  HCT 40.0  PLT 291    Chemistries  Recent Labs  Lab 10/03/17 0411  NA 137  K 3.7  CL 105  CO2 25  GLUCOSE 85  BUN 11  CREATININE 0.65  CALCIUM 8.8*  MG 2.0    Cardiac Enzymes Recent Labs  Lab 09/28/17 1911  TROPONINI <0.03    Microbiology Results  Results for orders placed or performed during the hospital encounter of 10/02/17  Urine Culture     Status: Abnormal (Preliminary result)   Collection Time: 10/02/17  4:18 PM  Result Value Ref Range Status   Specimen Description   Final    URINE, RANDOM Performed at [facility], [address], [place], [place] [address]    Special Requests   Final    NONE Performed at [facility], [address] [place], [place] [address]    Culture >=100,000 COLONIES/mL ESCHERICHIA COLI (A)  Final   Report Status PENDING  Incomplete    RADIOLOGY:  Dg Chest 2 View  Result Date: 10/02/2017 CLINICAL DATA:  Hypertension numbness and tingling EXAM: CHEST  2 VIEW COMPARISON:  None. FINDINGS: Diffuse bronchitic changes. No consolidation or effusion. Normal cardiomediastinal silhouette with aortic atherosclerosis. Negative for pneumothorax. IMPRESSION: Diffuse bronchitic changes.  No focal pulmonary opacity. Electronically Signed   By: Donavan Foil M.D.   On: 10/02/2017 23:06   Ct Head Wo Contrast  Result Date: 10/02/2017 CLINICAL DATA:  Slurred speech. Numbness and tingling. Subacute neurologic deficit. Hypertension. EXAM: CT HEAD WITHOUT  CONTRAST TECHNIQUE: Contiguous axial images were obtained from the base of the skull through the vertex without intravenous contrast. COMPARISON:  None. FINDINGS: Brain: No evidence of acute infarction, hemorrhage, hydrocephalus, extra-axial collection, or mass lesion/mass effect. Vascular:  No hyperdense vessel or other acute findings. Skull: No evidence of fracture or other significant bone abnormality. Sinuses/Orbits:  No acute findings. Other: None. IMPRESSION: Negative noncontrast head CT. Electronically Signed   By: Earle Gell M.D.   On: 10/02/2017 17:09   Mr Brain Wo Contrast  Result Date: 10/03/2017 CLINICAL DATA:  Facial paresthesia.  Side not specified. EXAM: MRI HEAD WITHOUT CONTRAST MRA HEAD WITHOUT CONTRAST TECHNIQUE: Multiplanar, multiecho pulse sequences of the brain and surrounding structures were obtained without intravenous contrast. Angiographic images of the head were obtained using MRA technique without contrast. COMPARISON:  Head CT from yesterday FINDINGS: MRI HEAD FINDINGS Brain: No  explanation for symptoms. Unremarkable appearance of the brainstem, cisterns, Meckel's cave, and skull base. No acute infarct, hemorrhage, hydrocephalus, or masslike finding. Few FLAIR hyperintensities in the cerebral white matter, age normal. Vascular: Major flow voids are preserved. Skull and upper cervical spine: Negative for marrow lesion. Sinuses/Orbits: No acute finding. Congested appearance of the nasal mucosa. Right cataract resection. MRA HEAD FINDINGS Symmetric carotid arteries. Dominant right vertebral artery with most of the left vertebral artery flow into the pica. There is atherosclerotic type undulation of the vertebral and basilar arteries. High-grade bilateral proximal P2 segment stenoses followed by diffuse atheromatous irregularity and poor signal. Less extensive atheromatous changes in the anterior circulation, although there is mild to moderate right M1 and left M2 branch segment  stenosis. Negative for aneurysm or signs of vascular malformation. IMPRESSION: No acute finding, brain MRI: Age normal appearance.  No explanation for facial paresthesia. Intracranial MRA: 1. No acute finding. 2. 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 3. Less advanced atheromatous changes in the anterior circulation, most notably mild to moderate right M1 and left M2 branch stenosis. Electronically Signed   By: Monte Fantasia M.D.   On: 10/03/2017 20:30   US Carotid Bilateral (at [facility] Only)  Result Date: 10/03/2017 CLINICAL DATA:  77 year old female with a history of hyponatremia EXAM: BILATERAL CAROTID DUPLEX ULTRASOUND TECHNIQUE: Pearline Cables scale imaging, color Doppler and duplex ultrasound were performed of bilateral carotid and vertebral arteries in the neck. COMPARISON:  No prior duplex FINDINGS: Criteria: Quantification of carotid stenosis is based on velocity parameters that correlate the residual internal carotid diameter with NASCET-based stenosis levels, using the diameter of the distal internal carotid lumen as the denominator for stenosis measurement. The following velocity measurements were obtained: RIGHT ICA:  Systolic 381 cm/sec, Diastolic 15 cm/sec CCA:  82 cm/sec SYSTOLIC ICA/CCA RATIO:  1.6 ECA:  143 cm/sec LEFT ICA:  Systolic 97 cm/sec, Diastolic 26 cm/sec CCA:  91 cm/sec SYSTOLIC ICA/CCA RATIO:  1.1 ECA:  107 cm/sec Right Brachial SBP: Not acquired Left Brachial SBP: Not acquired RIGHT CAROTID ARTERY: No significant calcifications of the right common carotid artery. Intermediate waveform maintained. Heterogeneous and partially calcified plaque at the right carotid bifurcation. No significant lumen shadowing. Low resistance waveform of the right ICA. No significant tortuosity. RIGHT VERTEBRAL ARTERY: Antegrade flow with low resistance waveform. LEFT CAROTID ARTERY: No significant calcifications of the left common carotid artery. Intermediate waveform maintained.  Heterogeneous and partially calcified plaque at the left carotid bifurcation without significant lumen shadowing. Low resistance waveform of the left ICA. No significant tortuosity. LEFT VERTEBRAL ARTERY:  Antegrade flow with low resistance waveform. IMPRESSION: Color duplex indicates minimal heterogeneous and calcified plaque, with no hemodynamically significant stenosis by duplex criteria in the extracranial cerebrovascular circulation. Signed, Dulcy Fanny. Earleen Newport, DO [facility] Radiology Electronically Signed   By: Corrie Mckusick D.O.   On: 10/03/2017 09:04   Mr Jodene Nam Head/brain WE Cm  Result Date: 10/03/2017 CLINICAL DATA:  Facial paresthesia.  Side not specified. EXAM: MRI HEAD WITHOUT CONTRAST MRA HEAD WITHOUT CONTRAST TECHNIQUE: Multiplanar, multiecho pulse sequences of the brain and surrounding structures were obtained without intravenous contrast. Angiographic images of the head were obtained using MRA technique without contrast. COMPARISON:  Head CT from yesterday FINDINGS: MRI HEAD FINDINGS Brain: No explanation for symptoms. Unremarkable appearance of the brainstem, cisterns, Meckel's cave, and skull base. No acute infarct, hemorrhage, hydrocephalus, or masslike finding. Few FLAIR hyperintensities in the cerebral white matter, age  normal. Vascular: Major flow voids are preserved. Skull and upper cervical spine: Negative for marrow lesion. Sinuses/Orbits: No acute finding. Congested appearance of the nasal mucosa. Right cataract resection. MRA HEAD FINDINGS Symmetric carotid arteries. Dominant right vertebral artery with most of the left vertebral artery flow into the pica. There is atherosclerotic type undulation of the vertebral and basilar arteries. High-grade bilateral proximal P2 segment stenoses followed by diffuse atheromatous irregularity and poor signal. Less extensive atheromatous changes in the anterior circulation, although there is mild to  moderate right M1 and left M2 branch segment stenosis. Negative for aneurysm or signs of vascular malformation. IMPRESSION: No acute finding, brain MRI: Age normal appearance.  No explanation for facial paresthesia. Intracranial MRA: 1. No acute finding. 2. 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 3. Less advanced atheromatous changes in the anterior circulation, most notably mild to moderate right M1 and left M2 branch stenosis. Electronically Signed   By: Monte Fantasia M.D.   On: 10/03/2017 20:30    EKG:   Orders placed or performed during the hospital encounter of 10/02/17  . ED EKG  . ED EKG  . EKG 12-Lead  . EKG 12-Lead      Management plans discussed with the patient, family and they are in agreement.  CODE STATUS:     Code Status Orders  (From admission, onward)        Start     Ordered   10/02/17 2001  Full code  Continuous     10/02/17 2001    Code Status History    Date Active Date Inactive Code Status Order ID Comments User Context   10/02/2017 20:01 10/02/2017 20:01 Full Code [id number]  SalaryAvel Peace, MD ED    Advance Directive Documentation     Most Recent Value  Type of Advance Directive  Healthcare Power of Attorney, Living will  Pre-existing out of facility DNR order (yellow form or pink MOST form)  No data  "MOST" Form in Place?  No data      TOTAL TIME TAKING CARE OF THIS PATIENT: 43 minutes.   Note: This dictation was prepared with Dragon dictation along with smaller phrase technology. Any transcriptional errors that result from this process are unintentional.   @[facility] @  on 10/04/2017 at 2:03 PM  Between 7am to 6pm - Pager - [phone]  After 6pm go to [email or web address] - password [email or web address] [facility]  [facility]  Office  [phone]  CC: Primary care physician; Glendon Axe, MD

## 2017-10-05 LAB — URINE CULTURE: Culture: >=100000 — AB

## 2018-04-02 ENCOUNTER — Emergency Department
Admission: EM | Admit: 2018-04-02 | Discharge: 2018-04-02 | Disposition: A | Discharge status: Home / Self Care | Payer: Medicare Other | Attending: Emergency Medicine | Admitting: Emergency Medicine

## 2018-04-02 ENCOUNTER — Other Ambulatory Visit: Payer: Self-pay

## 2018-04-02 DIAGNOSIS — I10 Essential (primary) hypertension: Secondary | ICD-10-CM | POA: Diagnosis present

## 2018-04-02 DIAGNOSIS — Z5321 Procedure and treatment not carried out due to patient leaving prior to being seen by health care provider: Secondary | ICD-10-CM | POA: Insufficient documentation

## 2018-04-02 NOTE — ED Triage Notes (Signed)
Pt here for HTN. Hx of HTN. States takes BP meds. Has a list of home BP's with her. Alert, oriented. 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 States feels like body is swollen.

## 2018-04-02 NOTE — ED Notes (Signed)
Per Dr. Cinda Quest no orders at this time.

## 2018-09-14 ENCOUNTER — Other Ambulatory Visit: Payer: Self-pay | Admitting: Internal Medicine

## 2018-09-14 DIAGNOSIS — R1310 Dysphagia, unspecified: Secondary | ICD-10-CM

## 2018-09-29 ENCOUNTER — Other Ambulatory Visit: Payer: Self-pay | Admitting: Student

## 2018-09-29 ENCOUNTER — Other Ambulatory Visit (HOSPITAL_COMMUNITY): Payer: Self-pay | Admitting: Student

## 2018-09-29 DIAGNOSIS — R1310 Dysphagia, unspecified: Secondary | ICD-10-CM

## 2018-09-29 DIAGNOSIS — K219 Gastro-esophageal reflux disease without esophagitis: Secondary | ICD-10-CM

## 2018-10-05 ENCOUNTER — Ambulatory Visit
Admission: RE | Admit: 2018-10-05 | Discharge: 2018-10-05 | Disposition: A | Discharge status: Home / Self Care | Payer: Medicare Other | Source: Ambulatory Visit | Attending: Student | Admitting: Student

## 2018-10-05 DIAGNOSIS — K219 Gastro-esophageal reflux disease without esophagitis: Secondary | ICD-10-CM

## 2018-10-05 DIAGNOSIS — R1310 Dysphagia, unspecified: Secondary | ICD-10-CM | POA: Diagnosis present

## 2018-10-06 ENCOUNTER — Other Ambulatory Visit: Payer: Self-pay | Admitting: Internal Medicine

## 2018-10-06 ENCOUNTER — Ambulatory Visit
Admission: RE | Admit: 2018-10-06 | Discharge: 2018-10-06 | Disposition: A | Discharge status: Home / Self Care | Payer: Medicare Other | Source: Ambulatory Visit | Attending: Internal Medicine | Admitting: Internal Medicine

## 2018-10-06 DIAGNOSIS — Z1231 Encounter for screening mammogram for malignant neoplasm of breast: Secondary | ICD-10-CM | POA: Diagnosis present

## 2018-10-13 ENCOUNTER — Ambulatory Visit
Admission: RE | Admit: 2018-10-13 | Discharge: 2018-10-13 | Disposition: A | Discharge status: Home / Self Care | Payer: Medicare Other | Source: Ambulatory Visit | Attending: Internal Medicine | Admitting: Internal Medicine

## 2018-10-13 DIAGNOSIS — R1312 Dysphagia, oropharyngeal phase: Secondary | ICD-10-CM

## 2018-10-13 DIAGNOSIS — R1310 Dysphagia, unspecified: Secondary | ICD-10-CM | POA: Diagnosis present

## 2018-10-13 NOTE — Therapy (Addendum)
[place], [place], [address] Phone: [phone]   Fax:     Modified Barium Swallow  Patient Details  Name: Autumn Kerr MRN: [id number] Date of Birth: 02-26-1941 No data recorded  Encounter Date: 10/13/2018  End of Session - 10/13/18 1616    Visit Number  1    Number of Visits  1    Date for SLP Re-Evaluation  10/13/18    SLP Start Time  12    SLP Stop Time   1400    SLP Time Calculation (min)  60 min    Activity Tolerance  Patient tolerated treatment well       Past Medical History:  Diagnosis Date  . Anxiety   . Arthritis   . Cancer ([place])    malignant melanoma of skin  . Diabetes mellitus without complication ([place])   . GERD (gastroesophageal reflux disease)   . Hypercholesterolemia   . Hyperlipemia   . Hypertension   . Neuropathy   . Osteoarthritis   . Osteopenia   . Thyroid disease   . Vitamin D deficiency   . Vocal cord dysfunction     Past Surgical History:  Procedure Laterality Date  . EYE SURGERY Right    cataract    There were no vitals filed for this visit.      Subjective: Patient behavior: (alertness, ability to follow instructions, etc.): Pt was A/O x3 and followed instructions appropriately, however, noted to be NONVERBAL w/ limited phonations in attempt to verbally respond to questions. She utilized a Archivist to communicate w/ SLP/others during this session. When asked about this, she stated she has had increasing difficulty w/ verbal communication since "the Summer" and understood it to be related to her "reflux" impacting her vocal quality. Per chart notes and pt's report, she has no h/o Neurological issues. She has NOT seen a Neurologist per report.  Per a MBSS done in May 2019, pt was dx'd w/ mild oropharyngeal phase dysphagia w/ noted vocal quality changes and DYSARTHRIA during the session of "~1 year" in origin but no known etiology then.  Chief  complaint: dysphagia. Pt does have dxs of GERD; Hiatal Hernia. A recent Barium study on 10/05/2018 reported "No esophageal stricture, ulceration or mass. No gastroesophageal Reflux", though oral phase deficits noted in bolus transfer.  Oral Motor Exam: native dentition; lingual strength for BOT and protrusion ~1/5. Noted slight lingual deviation to the Right during protrusion. Labial tone/ROM appeared grossly wfl. Cough: moderately+ reduced in effort and effectiveness.    Objective:  Radiological Procedure: A videoflouroscopic evaluation of oral-preparatory, reflex initiation, and pharyngeal phases of the swallow was performed; as well as a screening of the upper esophageal phase.  I. POSTURE: upright II. VIEW: lateral III. COMPENSATORY STRATEGIES: f/u, Dry swallows; lingual sweeping IV. BOLUSES ADMINISTERED:  Thin Liquid: not tested  Nectar-thick Liquid: 1 trial via Cup  Honey-thick Liquid: 2 trials via TSP, cup sip trial  Puree: 3 trials  Mechanical Soft: not tested V. RESULTS OF EVALUATION: A. ORAL PREPARATORY PHASE: (The lips, tongue, and velum are observed for strength and coordination)       **Overall Severity Rating: Moderate-Severe. Pt presented w/ reduced/poor bolus management during the oral phase w/ all consistencies assessed. Lingual movements were weak and disorganized resulting in lingual "pumping" to achieve A-P transfer of bolus trials; pt tilted her head back to aid A-P transfer. The decreased bolus cohesion, and reduced lingual control and strength, resulted in  piecemealed bolus amounts spilling into the pharynx prematurely moreso w/ Nectar trials(via cup). W/ trials of Honey consistency and puree boluses, the weight of the boluses appeared to aid in oral control during A-P transfer through the oral phase over the BOT into the pharynx. All movements were slow and effortful. Due to the lingual weakness, a MODERATE amount of oral residue remained w/ all bolus consistencies. With f/u,  dry swallows, pt was able to reduce this oral residue b/t trials.   B. SWALLOW INITIATION/REFLEX: (The reflex is normal if "triggered" by the time the bolus reached the base of the tongue)  **Overall Severity Rating: Moderate+. Pt exhibited a delayed pharyngeal swallow initiation w/ consistencies assessed; the initiation of the swallow also appeared disorganized and not timely for airway closure/protection. Noted reduced Epiglottic movement and inversion except w/ the heavier weighted bolus consistencies(puree primarily). This resulted in laryngeal penetration and aspiration of Nectar consistency liquids before/during the swallow. Pt responded w/ a fair Cough reflex and put her hand to her throat as she attempted to cough(reduced effectiveness noted).    C. PHARYNGEAL PHASE: (Pharyngeal function is normal if the bolus shows rapid, smooth, and continuous transit through the pharynx and there is no pharyngeal residue after the swallow)  **Overall Severity Rating: Moderate+. Pharyngeal residue was noted throughout the pharynx post swallow. The reduced BOT strength and contact along the pharyngeal wall and decreased pharyngeal pressure during the swallow resulted in poor bolus motility through the pharynx w/ residue remaining on BOT, a moderate amount in the Valleculae, a coating along the distal pharyngeal wall, and min amount in the pyriform sinuses. Again, there was reduced Epiglottic movement and inversion during the swallow w/ reduced anterior movement during hyolaryngeal excursion. With f/u, dry swallows, pt was able to reduce the pharyngeal residue b/t trials.    D. LARYNGEAL PENETRATION: (Material entering into the laryngeal inlet/vestibule but not aspirated): (w/ Nectar liquid trial) E. ASPIRATION: x1 w/ trial of Nectar liquid F. ESOPHAGEAL PHASE: (Screening of the upper esophagus): no bolus residue noted just below the UES but noted min slower bolus motility below the level of the shoulders(however,  difficult to view d/t shoulders obscuring view). Pt does have a baseline h/o GERD.   ASSESSMENT: Pt appears to present w/ Moderate-Severe Oropharyngeal phase Dysphagia and is at increased risk for aspiration d/t the Dysphagia as well as at decreased ability to meet nutrition/hydration needs adequately and safely over time. During the Oral phase, pt presented w/ reduced/poor bolus management during the oral phase w/ all consistencies assessed. Lingual movements were weak and disorganized resulting in lingual "pumping" to achieve A-P transfer of bolus trials; pt tilted her head back to aid A-P transfer. The decreased bolus cohesion, and reduced lingual control and strength, resulted in piecemealed bolus amounts spilling into the pharynx prematurely moreso w/ Nectar trials(via cup). W/ trials of Honey consistency and puree boluses, the weight of the boluses appeared to aid in oral control during A-P transfer through the oral phase over the BOT into the pharynx. All movements were slow and effortful. Due to the lingual weakness, a MODERATE amount of oral residue remained w/ all bolus consistencies. With f/u, dry swallows, pt was able to reduce this oral residue b/t trials. During the Pharyngeal phase, pt exhibited a delayed pharyngeal swallow initiation w/ consistencies assessed; the initiation of the swallow also appeared disorganized and not timely for airway closure/protection. Noted reduced Epiglottic movement and inversion except w/ the heavier weighted bolus consistencies(puree primarily). This resulted in laryngeal penetration  and aspiration of Nectar consistency liquids before/during the swallow. Pt responded w/ a fair Cough reflex and put her hand to her throat as she attempted to cough(reduced effectiveness noted however - suspect reduced Vocal Cord function?). Pharyngeal residue was noted throughout the pharynx post swallow. The reduced BOT strength and contact along the pharyngeal wall and decreased  pharyngeal pressure during the swallow resulted in poor bolus motility through the pharynx w/ residue remaining on BOT, a moderate amount in the Valleculae, a coating along the distal pharyngeal wall, and min amount in the pyriform sinuses. Again, there was reduced Epiglottic movement and inversion during the swallow w/ reduced anterior movement during hyolaryngeal excursion. With f/u, dry swallows, pt was able to reduce the pharyngeal residue b/t trials. Unfortunately, straw use during drinking was not assessed - pt has utilized straw drinking to achieve oropharyngeal swallowing since her last MBSS in May 2019.   Due to pt's presentation during this exam today, strict aspiration precautions were reviewed/discussed w/ pt. Recommended a more Honey consistency liquid for safety of swallowing per the results of this exam today, however, to continue the liquid consistency she has been drinking at home (w/ straw use) as long as no overt s/s of aspiration are experienced - this in light of her report of no Pulmonary issues or other negative sequelae w/ her current diet consistency. Discussed the use of more blended, pureed foods, preparation, and options for ease of oral intake. Pt has endorsed Weight loss in the past year. 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 These issues along w/ the reduced Vocal quality/dysfunction and inability to verbalize for communication w/ others indicates a strong need for a full assessment by a Neurologist to determine etiology and prognosis.    PLAN/RECOMMENDATIONS:  A. Diet: Dysphagia level 1 (PUREE) w/ Honey consistency liquids. Pills Crushed in Puree for safer swallowing.  B. Swallowing Precautions: aspiration precautions including f/u, Dry swallows w/ each bite/sip; lingual sweeping to clear; alternating foods and liquids  C. Recommended consultation to: Neurology for further  assessment of Oral Apraxia, Dysarthria, and Vocal dysfunction; Dietitian f/u for nutritional support  D. Therapy recommendations: Outpatient therapy to address oropharyngeal phase dysphagia; OMEs and pharyngeal swallowing exs.; assessment of Speech Apraxia  E. Results and recommendations were discussed w/ pt; handouts given along w/ Honey consistency liquids         Patient will benefit from skilled therapeutic intervention in order to improve the following deficits and impairments:   Dysphagia, oropharyngeal phase  Dysphagia, unspecified type - Plan: DG SWALLOW FUNC W VID CINE [place] NECK DELAYED IMAGE WITH BA MEDICARE, DG SWALLOW FUNC W VID CINE [place] NECK DELAYED IMAGE WITH BA MEDICARE        Problem List Patient Active Problem List   Diagnosis Date Noted  . 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

## 2019-04-05 ENCOUNTER — Other Ambulatory Visit: Payer: Self-pay | Admitting: Neurology

## 2019-04-05 ENCOUNTER — Other Ambulatory Visit (HOSPITAL_COMMUNITY): Payer: Self-pay | Admitting: Neurology

## 2019-04-05 DIAGNOSIS — R1310 Dysphagia, unspecified: Secondary | ICD-10-CM

## 2019-04-18 ENCOUNTER — Ambulatory Visit: Payer: PRIVATE HEALTH INSURANCE

## 2019-04-24 ENCOUNTER — Ambulatory Visit
Admission: RE | Admit: 2019-04-24 | Discharge: 2019-04-24 | Disposition: A | Discharge status: Home / Self Care | Payer: Medicare Other | Source: Ambulatory Visit | Attending: Neurology | Admitting: Neurology

## 2019-04-24 ENCOUNTER — Other Ambulatory Visit: Payer: Self-pay

## 2019-04-24 DIAGNOSIS — R1310 Dysphagia, unspecified: Secondary | ICD-10-CM | POA: Insufficient documentation

## 2019-04-24 LAB — POCT I-STAT CREATININE: Creatinine, Ser: 0.7 mg/dL (ref 0.44–1.00)

## 2019-04-24 MED ORDER — GADOBUTROL 1 MMOL/ML IV SOLN
7.0000 mL | Freq: Once | INTRAVENOUS | Status: AC | PRN
Start: 2019-04-24 — End: 2019-04-24
  Administered 2019-04-24: 7 mL via INTRAVENOUS

## 2019-05-13 ENCOUNTER — Emergency Department
Admission: EM | Admit: 2019-05-13 | Discharge: 2019-05-14 | Disposition: A | Discharge status: Home / Self Care | Payer: Medicare Other | Attending: Student in an Organized Health Care Education/Training Program | Admitting: Student in an Organized Health Care Education/Training Program

## 2019-05-13 ENCOUNTER — Other Ambulatory Visit: Payer: Self-pay

## 2019-05-13 ENCOUNTER — Emergency Department: Payer: Medicare Other

## 2019-05-13 ENCOUNTER — Encounter: Payer: Self-pay | Admitting: Emergency Medicine

## 2019-05-13 DIAGNOSIS — Z85828 Personal history of other malignant neoplasm of skin: Secondary | ICD-10-CM | POA: Insufficient documentation

## 2019-05-13 DIAGNOSIS — N3000 Acute cystitis without hematuria: Secondary | ICD-10-CM | POA: Diagnosis not present

## 2019-05-13 DIAGNOSIS — Z79899 Other long term (current) drug therapy: Secondary | ICD-10-CM | POA: Insufficient documentation

## 2019-05-13 DIAGNOSIS — R531 Weakness: Secondary | ICD-10-CM | POA: Diagnosis present

## 2019-05-13 DIAGNOSIS — E1140 Type 2 diabetes mellitus with diabetic neuropathy, unspecified: Secondary | ICD-10-CM | POA: Diagnosis not present

## 2019-05-13 DIAGNOSIS — I10 Essential (primary) hypertension: Secondary | ICD-10-CM | POA: Diagnosis not present

## 2019-05-13 LAB — CBC
HCT: 37 % (ref 36.0–46.0)
Hemoglobin: 12.2 g/dL (ref 12.0–15.0)
MCH: 30.4 pg (ref 26.0–34.0)
MCHC: 33 g/dL (ref 30.0–36.0)
MCV: 92.3 fL (ref 80.0–100.0)
Platelets: 196 10*3/uL (ref 150–400)
RBC: 4.01 MIL/uL (ref 3.87–5.11)
RDW: 13.2 % (ref 11.5–15.5)
WBC: 8 10*3/uL (ref 4.0–10.5)
nRBC: 0 % (ref 0.0–0.2)

## 2019-05-13 LAB — URINALYSIS, COMPLETE (UACMP) WITH MICROSCOPIC
Bilirubin Urine: NEGATIVE
Glucose, UA: NEGATIVE mg/dL
Hgb urine dipstick: NEGATIVE
Ketones, ur: 5 mg/dL — AB
Nitrite: POSITIVE — AB
Protein, ur: NEGATIVE mg/dL
Specific Gravity, Urine: 1.008 (ref 1.005–1.030)
Squamous Epithelial / HPF: NONE SEEN (ref 0–5)
WBC, UA: >50 WBC/hpf — ABNORMAL HIGH (ref 0–5)
pH: 7 (ref 5.0–8.0)

## 2019-05-13 LAB — BASIC METABOLIC PANEL
Anion gap: 8 (ref 5–15)
BUN: 15 mg/dL (ref 8–23)
CO2: 24 mmol/L (ref 22–32)
Calcium: 8.8 mg/dL — ABNORMAL LOW (ref 8.9–10.3)
Chloride: 108 mmol/L (ref 98–111)
Creatinine, Ser: 0.67 mg/dL (ref 0.44–1.00)
GFR calc Af Amer: >60 mL/min (ref >60)
GFR calc non Af Amer: >60 mL/min (ref >60)
Glucose, Bld: 92 mg/dL (ref 70–99)
Potassium: 4.2 mmol/L (ref 3.5–5.1)
Sodium: 140 mmol/L (ref 135–145)

## 2019-05-13 LAB — GLUCOSE, CAPILLARY: Glucose-Capillary: 87 mg/dL (ref 70–99)

## 2019-05-13 MED ORDER — DEXTROSE 5 % IV SOLN
580.0000 mg | Freq: Once | INTRAVENOUS | Status: AC
  Administered 2019-05-13: 580 mg via INTRAVENOUS
  Filled 2019-05-13: qty 11.6

## 2019-05-13 MED ORDER — SODIUM CHLORIDE 0.9 % IV SOLN
1.0000 g | Freq: Once | INTRAVENOUS | Status: AC
  Administered 2019-05-13: 1 g via INTRAVENOUS
  Filled 2019-05-13: qty 10

## 2019-05-13 MED ORDER — CARVEDILOL 6.25 MG PO TABS
12.5000 mg | ORAL_TABLET | Freq: Two times a day (BID) | ORAL | Status: DC
  Administered 2019-05-13: 12.5 mg via ORAL
  Filled 2019-05-13: qty 2

## 2019-05-13 MED ORDER — SODIUM CHLORIDE 0.9% FLUSH: 3.0000 mL | Freq: Once | INTRAVENOUS | Status: DC

## 2019-05-13 MED ORDER — CEFDINIR 250 MG/5ML PO SUSR: 300.0000 mg | Freq: Two times a day (BID) | ORAL | 0 refills | Status: AC

## 2019-05-13 MED ORDER — AMLODIPINE BESYLATE 5 MG PO TABS
5.0000 mg | ORAL_TABLET | Freq: Once | ORAL | Status: AC
  Administered 2019-05-13: 21:00:00 5 mg via ORAL
  Filled 2019-05-13: qty 1

## 2019-05-13 NOTE — ED Notes (Signed)
Report given to Gracie, RN 

## 2019-05-13 NOTE — ED Notes (Signed)
X-ray at bedside

## 2019-05-13 NOTE — ED Provider Notes (Signed)
[facility] Emergency Department Provider Note    First MD Initiated Contact with Patient 05/13/19 1942     (approximate)  I have reviewed the triage vital signs and the nursing notes.   HISTORY  Chief Complaint Weakness and Hypoglycemia    HPI Autumn Kerr is a 78 y.o. female below listed past past medical history currently being evaluated for presumptive ALS but without confirmatory diagnosis yet presents the ER for concern of her elevated blood pressure brief episode of right-sided headache and some generalized weakness today.  Was concerned because her blood pressure was [id number] systolic.  She is on carvedilol twice daily.  Has been compliant with her medications.  No report of any fevers.  No new numbness or tingling.  Denies any chest pain or shortness of breath.  States that she feels very well at this time.    Past Medical History:  Diagnosis Date  . Anxiety   . Arthritis   . Cancer ([place])    malignant melanoma of skin  . Diabetes mellitus without complication ([place])   . GERD (gastroesophageal reflux disease)   . Hypercholesterolemia   . Hyperlipemia   . Hypertension   . Neuropathy   . Osteoarthritis   . Osteopenia   . Thyroid disease   . Vitamin D deficiency   . Vocal cord dysfunction    No family history on file. Past Surgical History:  Procedure Laterality Date  . EYE SURGERY Right    cataract   Patient Active Problem List   Diagnosis Date Noted  . 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 Authorizing Provider  atorvastatin (LIPITOR) 20 MG tablet Take 1 tablet (20 mg total) by mouth daily at 6 PM. 10/04/17   Gouru, Aruna, MD  cefdinir (OMNICEF) 250 MG/5ML suspension Take 6 mLs (300 mg total) by mouth 2 (two) times daily for 7 days. 05/13/19 05/20/19  Merlyn Lot, MD  losartan (COZAAR) 25 MG tablet Take 3 tablets (75 mg total) by mouth daily. Patient taking  differently: Take 25 mg by mouth daily.  09/28/17   Rudene Re, MD  metoprolol tartrate (LOPRESSOR) 25 MG tablet Take 1 tablet (25 mg total) by mouth 2 (two) times daily. 10/04/17 10/04/18  Nicholes Mango, MD  Multiple Vitamin (MULTIVITAMIN WITH MINERALS) TABS tablet Take 1 tablet by mouth daily. 10/05/17   Nicholes Mango, MD  ranitidine (ZANTAC) 150 MG tablet Take 150 mg by mouth 2 (two) times daily. 09/27/17   [provider]  TIROSINT 125 MCG CAPS Take 125 mcg by mouth daily. 08/13/17   [provider]    Allergies Levothyroxine, Lisinopril, and Prednisone    Social History Social History   Tobacco Use  . Smoking status: Never Smoker  . Smokeless tobacco: Never Used  Substance Use Topics  . Alcohol use: Yes    Comment: occ  . Drug use: No    Review of Systems Patient denies headaches, rhinorrhea, blurry vision, numbness, shortness of breath, chest pain, edema, cough, abdominal pain, nausea, vomiting, diarrhea, dysuria, fevers, rashes or hallucinations unless otherwise stated above in HPI. ____________________________________________   PHYSICAL EXAM:  VITAL SIGNS: Vitals:   05/13/19 2133 05/13/19 2200  BP: (!) 169/73 (!) 155/68  Pulse: (!) 51 (!) 47  Resp:    Temp:    SpO2: 98% 97%    Constitutional: Alert and oriented.  Eyes: Conjunctivae are normal.  Head: Atraumatic. Nose: No  congestion/rhinnorhea. Mouth/Throat: Mucous membranes are moist.   Neck: No stridor. Painless ROM.  Cardiovascular: Normal rate, regular rhythm. Grossly normal heart sounds.  Good peripheral circulation. Respiratory: Normal respiratory effort.  No retractions. Lungs CTAB. Gastrointestinal: Soft and nontender. No distention. No abdominal bruits. No CVA tenderness. Genitourinary:  Musculoskeletal: No lower extremity tenderness nor edema.  No joint effusions. Neurologic:  Non verbal chronically.  MAE.   No gross focal neurologic deficits are appreciated. No facial droop  Skin:  Skin is warm, dry and intact. No rash noted. Psychiatric: Mood and affect are normal. Speech and behavior are normal.  ____________________________________________   LABS (all labs ordered are listed, but only abnormal results are displayed)  Results for orders placed or performed during the hospital encounter of 05/13/19 (from the past 24 hour(s))  Glucose, capillary     Status: None   Collection Time: 05/13/19  2:03 PM  Result Value Ref Range   Glucose-Capillary 87 70 - 99 mg/dL  Basic metabolic panel     Status: Abnormal   Collection Time: 05/13/19  2:09 PM  Result Value Ref Range   Sodium 140 135 - 145 mmol/L   Potassium 4.2 3.5 - 5.1 mmol/L   Chloride 108 98 - 111 mmol/L   CO2 24 22 - 32 mmol/L   Glucose, Bld 92 70 - 99 mg/dL   BUN 15 8 - 23 mg/dL   Creatinine, Ser 0.67 0.44 - 1.00 mg/dL   Calcium 8.8 (L) 8.9 - 10.3 mg/dL   GFR calc non Af Amer >60 >60 mL/min   GFR calc Af Amer >60 >60 mL/min   Anion gap 8 5 - 15  CBC     Status: None   Collection Time: 05/13/19  2:09 PM  Result Value Ref Range   WBC 8.0 4.0 - 10.5 K/uL   RBC 4.01 3.87 - 5.11 MIL/uL   Hemoglobin 12.2 12.0 - 15.0 g/dL   HCT 37.0 36.0 - 46.0 %   MCV 92.3 80.0 - 100.0 fL   MCH 30.4 26.0 - 34.0 pg   MCHC 33.0 30.0 - 36.0 g/dL   RDW 13.2 11.5 - 15.5 %   Platelets 196 150 - 400 K/uL   nRBC 0.0 0.0 - 0.2 %  Urinalysis, Complete w Microscopic     Status: Abnormal   Collection Time: 05/13/19  8:41 PM  Result Value Ref Range   Color, Urine YELLOW (A) YELLOW   APPearance TURBID (A) CLEAR   Specific Gravity, Urine 1.008 1.005 - 1.030   pH 7.0 5.0 - 8.0   Glucose, UA NEGATIVE NEGATIVE mg/dL   Hgb urine dipstick NEGATIVE NEGATIVE   Bilirubin Urine NEGATIVE NEGATIVE   Ketones, ur 5 (A) NEGATIVE mg/dL   Protein, ur NEGATIVE NEGATIVE mg/dL   Nitrite POSITIVE (A) NEGATIVE   Leukocytes,Ua LARGE (A) NEGATIVE   RBC / HPF 11-20 0 - 5 RBC/hpf   WBC, UA >50 (H) 0 - 5 WBC/hpf   Bacteria, UA MANY (A) NONE  SEEN   Squamous Epithelial / LPF NONE SEEN 0 - 5   WBC Clumps PRESENT    ____________________________________________  EKG My review and personal interpretation at Time: 14:17   Indication: weakness  Rate: 50  Rhythm: sinus Axis: normal Other: normal intervals, no stemi ____________________________________________  RADIOLOGY  I personally reviewed all radiographic images ordered to evaluate for the above acute complaints and reviewed radiology reports and findings.  These findings were personally discussed with the patient.  Please see medical record for  radiology report.  ____________________________________________   PROCEDURES  Procedure(s) performed:  Procedures    Critical Care performed: no ____________________________________________   INITIAL IMPRESSION / ASSESSMENT AND PLAN / ED COURSE  Pertinent labs & imaging results that were available during my care of the patient were reviewed by me and considered in my medical decision making (see chart for details).   DDX: htn, urgency, chf, electrolyte abn, migraine, tension headache, sah,  Autumn Kerr is a 78 y.o. who presents to the ED with symptoms as described above.  CT imaging does not show any evidence of acute abnormality.  Does not have any new focal deficits on exam.  Not consistent with subarachnoid.  No evidence of hypertensive urgency.  No evidence of pulmonary edema.  Will give for home antihypertensive medication.  A urinalysis did show nitrite positive UTI therefore was given Rocephin.  We discussed option for admission to the hospital for IV antibiotics and reassessment.  Patient feels well and after receiving IV antibiotics prefer to go home which I think is reasonable as she does not have any signs of sepsis.  We discussed signs and symptoms for which she should return to the ER.     The patient was evaluated in Emergency Department today for the symptoms described in the history of present illness. He/she  was evaluated in the context of the global COVID-19 pandemic, which necessitated consideration that the patient might be at risk for infection with the SARS-CoV-2 virus that causes COVID-19. Institutional protocols and algorithms that pertain to the evaluation of patients at risk for COVID-19 are in a state of rapid change based on information released by regulatory bodies including the CDC and federal and state organizations. These policies and algorithms were followed during the patient's care in the ED.  As part of my medical decision making, I reviewed the following data within the [place] notes reviewed and incorporated, Labs reviewed, notes from prior ED visits and [place] Controlled Substance Database   ____________________________________________   FINAL CLINICAL IMPRESSION(S) / ED DIAGNOSES  Final diagnoses:  Weakness  Acute cystitis without hematuria      NEW MEDICATIONS STARTED DURING THIS VISIT:  New Prescriptions   CEFDINIR (OMNICEF) 250 MG/5ML SUSPENSION    Take 6 mLs (300 mg total) by mouth 2 (two) times daily for 7 days.     Note:  This document was prepared using Dragon voice recognition software and may include unintentional dictation errors.    Merlyn Lot, MD 05/14/19 [phone]

## 2019-05-13 NOTE — Discharge Instructions (Addendum)
I have sent a prescription for Cefdinir, a twice daily antibiotic, to your pharmacy at [facility].  Please contact us if you have any difficulty getting this prescribed.  I have also added notes to the pharmacist to add a thickener if possible.  Please return to the ER if you have any additional questions or concerns.

## 2019-05-13 NOTE — ED Triage Notes (Signed)
Pt to ED via POV, pt c/o weakness and hypoglycemia. 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 Pt states that this morning her CBG was 88 and that her systolic blood pressure has been in the 150's all week. CBG 87 in triage. Pt is in NAD.

## 2019-05-13 NOTE — ED Notes (Signed)
Pt up to use bathroom 

## 2019-05-13 NOTE — ED Notes (Signed)
Dr Robinson at bedside 

## 2019-05-13 NOTE — ED Notes (Signed)
Patient transported to CT 

## 2019-08-07 ENCOUNTER — Other Ambulatory Visit: Payer: Self-pay

## 2019-08-07 ENCOUNTER — Emergency Department
Admission: EM | Admit: 2019-08-07 | Discharge: 2019-08-07 | Disposition: A | Discharge status: Home / Self Care | Payer: Medicare Other | Attending: Emergency Medicine | Admitting: Emergency Medicine

## 2019-08-07 ENCOUNTER — Encounter: Payer: Self-pay | Admitting: Emergency Medicine

## 2019-08-07 ENCOUNTER — Emergency Department: Payer: Medicare Other

## 2019-08-07 DIAGNOSIS — R531 Weakness: Secondary | ICD-10-CM | POA: Insufficient documentation

## 2019-08-07 DIAGNOSIS — I10 Essential (primary) hypertension: Secondary | ICD-10-CM | POA: Diagnosis not present

## 2019-08-07 DIAGNOSIS — G1221 Amyotrophic lateral sclerosis: Secondary | ICD-10-CM | POA: Insufficient documentation

## 2019-08-07 DIAGNOSIS — Z79899 Other long term (current) drug therapy: Secondary | ICD-10-CM | POA: Insufficient documentation

## 2019-08-07 DIAGNOSIS — E1140 Type 2 diabetes mellitus with diabetic neuropathy, unspecified: Secondary | ICD-10-CM | POA: Diagnosis not present

## 2019-08-07 DIAGNOSIS — E119 Type 2 diabetes mellitus without complications: Secondary | ICD-10-CM | POA: Diagnosis not present

## 2019-08-07 DIAGNOSIS — R0602 Shortness of breath: Secondary | ICD-10-CM | POA: Diagnosis not present

## 2019-08-07 LAB — BASIC METABOLIC PANEL
Anion gap: 9 (ref 5–15)
BUN: 17 mg/dL (ref 8–23)
CO2: 24 mmol/L (ref 22–32)
Calcium: 8.8 mg/dL — ABNORMAL LOW (ref 8.9–10.3)
Chloride: 106 mmol/L (ref 98–111)
Creatinine, Ser: 0.51 mg/dL (ref 0.44–1.00)
GFR calc Af Amer: >60 mL/min (ref >60)
GFR calc non Af Amer: >60 mL/min (ref >60)
Glucose, Bld: 89 mg/dL (ref 70–99)
Potassium: 4.1 mmol/L (ref 3.5–5.1)
Sodium: 139 mmol/L (ref 135–145)

## 2019-08-07 LAB — CBC
HCT: 38.6 % (ref 36.0–46.0)
Hemoglobin: 12.4 g/dL (ref 12.0–15.0)
MCH: 30.1 pg (ref 26.0–34.0)
MCHC: 32.1 g/dL (ref 30.0–36.0)
MCV: 93.7 fL (ref 80.0–100.0)
Platelets: 270 10*3/uL (ref 150–400)
RBC: 4.12 MIL/uL (ref 3.87–5.11)
RDW: 13.5 % (ref 11.5–15.5)
WBC: 6.8 10*3/uL (ref 4.0–10.5)
nRBC: 0 % (ref 0.0–0.2)

## 2019-08-07 LAB — TROPONIN I (HIGH SENSITIVITY): Troponin I (High Sensitivity): 3 ng/L (ref <18)

## 2019-08-07 MED ORDER — SODIUM CHLORIDE 0.9% FLUSH: 3.0000 mL | Freq: Once | INTRAVENOUS | Status: DC

## 2019-08-07 NOTE — ED Notes (Signed)
Patient wrote on the notepad that she wants to go home and follow up with her PMD. Dr. Charna Archer aware. Patient is unable to void at this time.

## 2019-08-07 NOTE — ED Notes (Signed)
Patient is walking in room, fully dressed. Son is at bedside. Hat was placed in room commode for patient's use and instructions were given to patient. Patient writes on a pad to communicate and wrote that she had just voided, but is trying again. Patient states she will need thickened liquids if she needs to drink. Dr. Charna Archer informed.

## 2019-08-07 NOTE — ED Triage Notes (Signed)
Shortness of breath. History of ALS. Uses CPAP recently for 12 hours per day. More weak than usual.

## 2019-08-07 NOTE — ED Provider Notes (Signed)
[facility] Emergency Department Provider Note   ____________________________________________   First MD Initiated Contact with Patient 08/07/19 1711     (approximate)  I have reviewed the triage vital signs and the nursing notes.   HISTORY  Chief Complaint Shortness of Breath    HPI Autumn Kerr is a 78 y.o. female with past medical history of ALS, hypertension, diabetes who presents to the ED complaining of weakness and shortness of breath.  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  Son states that the patient has been feeling a little weaker than usual throughout the day today and had some difficulty breathing.  Patient now nods "no" when asked if she continues to have difficulty breathing.  She denies any fevers, chills, cough, or chest pain and is not aware of any sick contacts.  She also denies any vomiting, diarrhea, abdominal pain, dysuria, or hematuria.  She follows with [facility] neurology for her ALS and was provided with a CPAP machine last week that has assisted with her breathing.        Past Medical History:  Diagnosis Date  . Anxiety   . Arthritis   . Cancer ([place])    malignant melanoma of skin  . Diabetes mellitus without complication ([place])   . GERD (gastroesophageal reflux disease)   . Hypercholesterolemia   . Hyperlipemia   . Hypertension   . Neuropathy   . Osteoarthritis   . Osteopenia   . Thyroid disease   . Vitamin D deficiency   . Vocal cord dysfunction     Patient Active Problem List   Diagnosis Date Noted  . 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 EYE SURGERY Right    cataract    Prior to Admission medications   Medication Sig Start Date End Date Taking? Authorizing Provider  atorvastatin (LIPITOR) 20 MG tablet Take 1 tablet (20 mg total) by mouth daily at 6 PM. 10/04/17   Gouru, Aruna, MD   losartan (COZAAR) 25 MG tablet Take 3 tablets (75 mg total) by mouth daily. Patient taking differently: Take 25 mg by mouth daily.  09/28/17   Rudene Re, MD  metoprolol tartrate (LOPRESSOR) 25 MG tablet Take 1 tablet (25 mg total) by mouth 2 (two) times daily. 10/04/17 10/04/18  Nicholes Mango, MD  Multiple Vitamin (MULTIVITAMIN WITH MINERALS) TABS tablet Take 1 tablet by mouth daily. 10/05/17   Nicholes Mango, MD  ranitidine (ZANTAC) 150 MG tablet Take 150 mg by mouth 2 (two) times daily. 09/27/17   [provider]  TIROSINT 125 MCG CAPS Take 125 mcg by mouth daily. 08/13/17   [provider]    Allergies Levothyroxine, Lisinopril, and Prednisone  No family history on file.  Social History Social History   Tobacco Use  . Smoking status: Never Smoker  . Smokeless tobacco: Never Used  Substance Use Topics  . Alcohol use: Yes    Comment: occ  . Drug use: No    Review of Systems  Constitutional: No fever/chills.  Positive for generalized weakness. Eyes: No visual changes. ENT: No sore throat. Cardiovascular: Denies chest pain. Respiratory: Positive for shortness of breath. Gastrointestinal: No abdominal pain.  No nausea, no vomiting.  No diarrhea.  No constipation. Genitourinary: Negative for dysuria. Musculoskeletal: Negative for back pain. Skin: Negative for rash. Neurological: Negative for headaches, focal weakness or numbness.  ____________________________________________   PHYSICAL  EXAM:  VITAL SIGNS: ED Triage Vitals  Enc Vitals Group     BP 08/07/19 1427 (!) 176/77     Pulse Rate 08/07/19 1427 (!) 58     Resp 08/07/19 1427 18     Temp 08/07/19 1427 99.1 F (37.3 C)     Temp Source 08/07/19 1427 Oral     SpO2 08/07/19 1427 97 %     Weight 08/07/19 1428 115 lb (52.2 kg)     Height 08/07/19 1428 5\' 3"  (1.6 m)     Head Circumference --      Peak Flow --      Pain Score 08/07/19 1428 0     Pain Loc --      Pain Edu? --      Excl. in [place]? --      Constitutional: Alert and oriented.  Nonverbal at baseline. Eyes: Conjunctivae are normal. Head: Atraumatic. Nose: No congestion/rhinnorhea. Mouth/Throat: Mucous membranes are moist. Neck: Normal ROM Cardiovascular: Normal rate, regular rhythm. Grossly normal heart sounds. Respiratory: Normal respiratory effort.  No retractions. Lungs CTAB. Gastrointestinal: Soft and nontender. No distention. Genitourinary: deferred Musculoskeletal: No lower extremity tenderness nor edema. Neurologic:  Normal speech and language. No gross focal neurologic deficits are appreciated. Skin:  Skin is warm, dry and intact. No rash noted. Psychiatric: Mood and affect are normal. Speech and behavior are normal.  ____________________________________________   LABS (all labs ordered are listed, but only abnormal results are displayed)  Labs Reviewed  BASIC METABOLIC PANEL - Abnormal; Notable for the following components:      Result Value   Calcium 8.8 (*)    All other components within normal limits  CBC  URINALYSIS, COMPLETE (UACMP) WITH MICROSCOPIC  TROPONIN I (HIGH SENSITIVITY)  TROPONIN I (HIGH SENSITIVITY)   ____________________________________________  EKG  ED ECG REPORT I, Blake Divine, the attending physician, personally viewed and interpreted this ECG.   Date: 08/07/2019  EKG Time: 14:33  Rate: 57  Rhythm: normal sinus rhythm  Axis: Normal  Intervals:none  ST&T Change: None   PROCEDURES  Procedure(s) performed (including Critical Care):  Procedures   ____________________________________________   INITIAL IMPRESSION / ASSESSMENT AND PLAN / ED COURSE       78 year old female with history of ALS presents to the ED with some increased weakness and difficulty breathing earlier today.  She does report that she continues to feel somewhat weaker than usual but her shortness of breath seems to have resolved.  She is not in any respiratory distress and is not requiring  supplemental oxygen, nods her head "no" when asked if she is having any trouble breathing currently.  Chest x-ray is negative for acute process, no evidence of pneumonia.  EKG shows no acute ischemic changes and troponin within normal limits, doubt ACS.  Remainder of labs are unremarkable.  Will screen UA given her generalized weakness, however I suspect her symptoms are related to progression of her ALS.  She does have CPAP available at home that has been helping with her breathing and I emphasized need for her to follow-up with her specialists.  Counseled patient and family to have discussion regarding what they would like done if her breathing were to worsen, but there does not appear to be any acute intervention needed at this time.  Patient attempted to provide urine sample but was unable to do so.  She now states that she would like to be discharged home, which is reasonable given her lack of urinary symptoms and overall  low suspicion for UTI.  Counseled patient to follow-up with her neurologist and return to the ED for new or worsening symptoms, patient agrees with plan.      ____________________________________________   FINAL CLINICAL IMPRESSION(S) / ED DIAGNOSES  Final diagnoses:  Shortness of breath  Generalized weakness  ALS (amyotrophic lateral sclerosis) [facility])     ED Discharge Orders    None       Note:  This document was prepared using Dragon voice recognition software and may include unintentional dictation errors.   Blake Divine, MD 08/07/19 [phone]

## 2019-10-15 ENCOUNTER — Emergency Department
Admission: EM | Admit: 2019-10-15 | Discharge: 2019-10-15 | Disposition: A | Discharge status: Home / Self Care | Payer: Medicare Other | Attending: Student | Admitting: Student

## 2019-10-15 ENCOUNTER — Encounter: Payer: Self-pay | Admitting: Emergency Medicine

## 2019-10-15 ENCOUNTER — Emergency Department: Payer: Medicare Other

## 2019-10-15 DIAGNOSIS — S199XXA Unspecified injury of neck, initial encounter: Secondary | ICD-10-CM | POA: Diagnosis not present

## 2019-10-15 DIAGNOSIS — Y9389 Activity, other specified: Secondary | ICD-10-CM | POA: Diagnosis not present

## 2019-10-15 DIAGNOSIS — I10 Essential (primary) hypertension: Secondary | ICD-10-CM | POA: Diagnosis not present

## 2019-10-15 DIAGNOSIS — W01198A Fall on same level from slipping, tripping and stumbling with subsequent striking against other object, initial encounter: Secondary | ICD-10-CM | POA: Diagnosis not present

## 2019-10-15 DIAGNOSIS — Y92000 Kitchen of unspecified non-institutional (private) residence as  the place of occurrence of the external cause: Secondary | ICD-10-CM | POA: Diagnosis not present

## 2019-10-15 DIAGNOSIS — E119 Type 2 diabetes mellitus without complications: Secondary | ICD-10-CM | POA: Insufficient documentation

## 2019-10-15 DIAGNOSIS — S1980XA Other specified injuries of unspecified part of neck, initial encounter: Secondary | ICD-10-CM

## 2019-10-15 DIAGNOSIS — Y999 Unspecified external cause status: Secondary | ICD-10-CM | POA: Diagnosis not present

## 2019-10-15 DIAGNOSIS — S0990XA Unspecified injury of head, initial encounter: Secondary | ICD-10-CM

## 2019-10-15 DIAGNOSIS — Z79899 Other long term (current) drug therapy: Secondary | ICD-10-CM | POA: Diagnosis not present

## 2019-10-15 MED ORDER — TRAMADOL HCL 50 MG PO TABS
50.0000 mg | ORAL_TABLET | Freq: Once | ORAL | Status: AC
  Administered 2019-10-15: 17:00:00 50 mg via ORAL
  Filled 2019-10-15: qty 1

## 2019-10-15 MED ORDER — TRAMADOL HCL 50 MG PO TABS: 50.0000 mg | ORAL_TABLET | Freq: Four times a day (QID) | ORAL | 0 refills | Status: AC | PRN

## 2019-10-15 NOTE — ED Triage Notes (Signed)
Pt to ED by EMS after unwitnessed fall. Pt states she turned around hit the kitchen cabinet. Pt has complaints of neck pain that radiates to shoulders. Pt has ALS and communicates with dry erase board.

## 2019-10-15 NOTE — Discharge Instructions (Signed)
Please follow up with primary care or return to the ER for symptoms of concern.

## 2019-10-15 NOTE — ED Notes (Addendum)
Pt acknowledged understanding of discharge instructions. NAD at this time.

## 2019-10-15 NOTE — ED Provider Notes (Signed)
[facility] Emergency Department Provider Note ____________________________________________   None    (approximate)  I have reviewed the triage vital signs and the nursing notes.   HISTORY  Chief Complaint Fall  HPI Autumn Kerr is a 79 y.o. female who presents to the emergency department for treatment and evaluation after a mechanical, nonsyncopal fall.  Patient states that she turned around the kitchen, lost her balance and hit her neck on a cabinet.  She has pain in the lower neck that radiates into her shoulders.  She also has some mid back pain.  No pain in extremities after fall.  She denies loss of consciousness.     Past Medical History:  Diagnosis Date  . Anxiety   . Arthritis   . Cancer ([place])    malignant melanoma of skin  . Diabetes mellitus without complication ([place])   . GERD (gastroesophageal reflux disease)   . Hypercholesterolemia   . Hyperlipemia   . Hypertension   . Neuropathy   . Osteoarthritis   . Osteopenia   . Thyroid disease   . Vitamin D deficiency   . Vocal cord dysfunction     Patient Active Problem List   Diagnosis Date Noted  . 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 EYE SURGERY Right    cataract    Prior to Admission medications   Medication Sig Start Date End Date Taking? Authorizing Provider  atorvastatin (LIPITOR) 20 MG tablet Take 1 tablet (20 mg total) by mouth daily at 6 PM. 10/04/17   Gouru, Aruna, MD  losartan (COZAAR) 25 MG tablet Take 3 tablets (75 mg total) by mouth daily. Patient taking differently: Take 25 mg by mouth daily.  09/28/17   Rudene Re, MD  metoprolol tartrate (LOPRESSOR) 25 MG tablet Take 1 tablet (25 mg total) by mouth 2 (two) times daily. 10/04/17 10/04/18  Nicholes Mango, MD  Multiple Vitamin (MULTIVITAMIN WITH MINERALS) TABS tablet Take 1 tablet by mouth daily. 10/05/17   Nicholes Mango, MD  ranitidine (ZANTAC) 150 MG tablet Take 150  mg by mouth 2 (two) times daily. 09/27/17   [provider]  TIROSINT 125 MCG CAPS Take 125 mcg by mouth daily. 08/13/17   [provider]  traMADol (ULTRAM) 50 MG tablet Take 1 tablet (50 mg total) by mouth every 6 (six) hours as needed. 10/15/19   Leolia Vinzant, Johnette Abraham B, FNP    Allergies Levothyroxine, Lisinopril, and Prednisone  No family history on file.  Social History Social History   Tobacco Use  . Smoking status: Never Smoker  . Smokeless tobacco: Never Used  Substance Use Topics  . Alcohol use: Yes    Comment: occ  . Drug use: No    Review of Systems  Constitutional: No fever/chills Eyes: No visual changes. ENT: No sore throat. Cardiovascular: Denies chest pain. Respiratory: Denies shortness of breath. Gastrointestinal: No abdominal pain.  No nausea, no vomiting.  No diarrhea.  No constipation. Genitourinary: Negative for dysuria. Musculoskeletal: Negative for back pain. Skin: Negative for rash. Neurological: Negative for headaches, focal weakness or numbness ____________________________________________   PHYSICAL EXAM:  VITAL SIGNS: ED Triage Vitals  Enc Vitals Group     BP 10/15/19 1619 (!) 180/82     Pulse Rate 10/15/19 1619 (!) 50     Resp 10/15/19 1619 (!) 25     Temp 10/15/19 1619 97.6 F (36.4 C)     Temp Source 10/15/19 1619 Oral  SpO2 10/15/19 1619 96 %     Weight --      Height --      Head Circumference --      Peak Flow --      Pain Score 10/15/19 1620 10     Pain Loc --      Pain Edu? --      Excl. in [place]? --     Constitutional: Alert and oriented. Well appearing and in no acute distress. Eyes: Conjunctivae are normal. PERRL. EOMI. Head: Atraumatic. Nose: No congestion/rhinnorhea. Mouth/Throat: Mucous membranes are moist.  Oropharynx non-erythematous. Neck: No stridor.   Hematological/Lymphatic/Immunilogical: No cervical lymphadenopathy. Cardiovascular: Normal rate, regular rhythm. Grossly normal heart sounds.  Good  peripheral circulation. Respiratory: Normal respiratory effort.  No retractions. Lungs CTAB. Gastrointestinal: Soft and nontender. No distention. No abdominal bruits. No CVA tenderness. Genitourinary:  Musculoskeletal: No lower extremity tenderness nor edema.  No joint effusions. Neurologic:  Normal speech and language. No gross focal neurologic deficits are appreciated. No gait instability. Skin:  Skin is warm, dry and intact. No rash noted. Psychiatric: Mood and affect are normal. Speech and behavior are normal.  ____________________________________________   LABS (all labs ordered are listed, but only abnormal results are displayed)  Labs Reviewed - No data to display ____________________________________________  EKG  Not indicated. ____________________________________________  RADIOLOGY  ED MD interpretation:    No acute bony abnormality of thoracic spine.  I, Sherrie George, personally viewed and evaluated these images (plain radiographs) as part of my medical decision making, as well as reviewing the written report by the radiologist.  Official radiology report(s): DG Thoracic Spine 2 View  Result Date: 10/15/2019 CLINICAL DATA:  Fall, back pain EXAM: THORACIC SPINE 2 VIEWS COMPARISON:  Two-view chest x-ray 08/07/2019. Today's cervical spine CT. FINDINGS: Normal alignment. No fracture or focal bone lesion. Early degenerative changes. IMPRESSION: No acute bony abnormality. Electronically Signed   By: Rolm Baptise M.D.   On: 10/15/2019 17:11   CT Head Wo Contrast  Result Date: 10/15/2019 CLINICAL DATA:  Fall, head and neck trauma EXAM: CT HEAD WITHOUT CONTRAST CT CERVICAL SPINE WITHOUT CONTRAST TECHNIQUE: Multidetector CT imaging of the head and cervical spine was performed following the standard protocol without intravenous contrast. Multiplanar CT image reconstructions of the cervical spine were also generated. COMPARISON:  05/13/2019 FINDINGS: CT HEAD FINDINGS Brain: No  evidence of acute infarction, hemorrhage, hydrocephalus, extra-axial collection or mass lesion/mass effect. Periventricular white matter hypodensity. Vascular: No hyperdense vessel or unexpected calcification. Skull: Normal. Negative for fracture or focal lesion. Sinuses/Orbits: No acute finding. Other: None. CT CERVICAL SPINE FINDINGS Alignment: Normal. Skull base and vertebrae: No acute fracture. No primary bone lesion or focal pathologic process. Soft tissues and spinal canal: No prevertebral fluid or swelling. No visible canal hematoma. Disc levels: Moderate multilevel disc space height loss and osteophytosis. Upper chest: Negative. Other: None. IMPRESSION: 1. No acute intracranial pathology. Small-vessel white matter disease. 2. No fracture or static subluxation of the cervical spine. Electronically Signed   By: Eddie Candle M.D.   On: 10/15/2019 17:02   CT Cervical Spine Wo Contrast  Result Date: 10/15/2019 CLINICAL DATA:  Fall, head and neck trauma EXAM: CT HEAD WITHOUT CONTRAST CT CERVICAL SPINE WITHOUT CONTRAST TECHNIQUE: Multidetector CT imaging of the head and cervical spine was performed following the standard protocol without intravenous contrast. Multiplanar CT image reconstructions of the cervical spine were also generated. COMPARISON:  05/13/2019 FINDINGS: CT HEAD FINDINGS Brain: No evidence of acute infarction, hemorrhage,  hydrocephalus, extra-axial collection or mass lesion/mass effect. Periventricular white matter hypodensity. Vascular: No hyperdense vessel or unexpected calcification. Skull: Normal. Negative for fracture or focal lesion. Sinuses/Orbits: No acute finding. Other: None. CT CERVICAL SPINE FINDINGS Alignment: Normal. Skull base and vertebrae: No acute fracture. No primary bone lesion or focal pathologic process. Soft tissues and spinal canal: No prevertebral fluid or swelling. No visible canal hematoma. Disc levels: Moderate multilevel disc space height loss and osteophytosis.  Upper chest: Negative. Other: None. IMPRESSION: 1. No acute intracranial pathology. Small-vessel white matter disease. 2. No fracture or static subluxation of the cervical spine. Electronically Signed   By: Eddie Candle M.D.   On: 10/15/2019 17:02    ____________________________________________   PROCEDURES  Procedure(s) performed (including Critical Care):  Procedures  ____________________________________________   INITIAL IMPRESSION / ASSESSMENT AND PLAN     79 year old female presenting to the emergency department for treatment and evaluation after mechanical, nonsyncopal fall at home.  See HPI for further details.  Plan will be to obtain CT of the head and cervical spine as well as diagnostic imaging of the thoracic spine.  DIFFERENTIAL DIAGNOSIS  Subarachnoid hemorrhage, intra cranial pathology, cervical or thoracic vertebral fracture, musculoskeletal pain/strain  ED COURSE  CT imaging of the head and cervical spine are negative for acute findings.  Thoracic spine shows no indication of fracture or other bony abnormalities.  Patient was given tramadol while here with decrease in pain.  Patient feels comfortable with plan of discharge.  She will be given a few tablets of tramadol to be taken if needed for severe pain.  She is to follow-up with her primary care provider or return to the emergency department for symptoms of change or worsen or for new concerns. ____________________________________________   FINAL CLINICAL IMPRESSION(S) / ED DIAGNOSES  Final diagnoses:  Minor head injury, initial encounter  Blunt trauma of neck, initial encounter     ED Discharge Orders         Ordered    traMADol (ULTRAM) 50 MG tablet  Every 6 hours PRN     10/15/19 1759           Autumn Kerr was evaluated in Emergency Department on 10/15/2019 for the symptoms described in the history of present illness. She was evaluated in the context of the global COVID-19 pandemic, which  necessitated consideration that the patient might be at risk for infection with the SARS-CoV-2 virus that causes COVID-19. Institutional protocols and algorithms that pertain to the evaluation of patients at risk for COVID-19 are in a state of rapid change based on information released by regulatory bodies including the CDC and federal and state organizations. These policies and algorithms were followed during the patient's care in the ED.   Note:  This document was prepared using Dragon voice recognition software and may include unintentional dictation errors.   Victorino Dike, FNP 10/15/19 1805    Lilia Pro., MD 10/15/19 [phone]

## 2020-01-04 ENCOUNTER — Telehealth: Payer: Self-pay | Admitting: Adult Health Nurse Practitioner

## 2020-01-04 NOTE — Telephone Encounter (Signed)
Called patient's son Legrand Como, to schedule the Palliative Consult, no answer - left message with reason for call along with my name and contact number.

## 2020-01-05 ENCOUNTER — Telehealth: Payer: Self-pay | Admitting: Adult Health Nurse Practitioner

## 2020-01-05 NOTE — Telephone Encounter (Signed)
Rec'd call back from patient's son, Scharlotte Guzy, and after discussing Palliative services he was in agreement with this.  I have scheduled an In-person Consult for 01/10/20 @ 3:30 PM.

## 2020-01-10 ENCOUNTER — Other Ambulatory Visit: Payer: Medicare Other | Admitting: Adult Health Nurse Practitioner

## 2020-01-10 ENCOUNTER — Other Ambulatory Visit: Payer: Self-pay

## 2020-01-10 DIAGNOSIS — Z515 Encounter for palliative care: Secondary | ICD-10-CM

## 2020-01-10 DIAGNOSIS — G1221 Amyotrophic lateral sclerosis: Secondary | ICD-10-CM

## 2020-01-10 NOTE — Progress Notes (Signed)
[facility] Consult Note Telephone: [phone]  Fax: [phone]  PATIENT NAME: Autumn Kerr DOB: [birth year] MRN: [id number]  PRIMARY CARE PROVIDER:   Kirk Ruths, MD  REFERRING PROVIDER:  Dr. Tedra Kerr  RESPONSIBLE PARTY:   Autumn Kerr, son     [phone]    RECOMMENDATIONS and PLAN:  1.  Advanced care planning.  Briefly discussed MOST form and left blank form for patient and family to go over  2.   ALS.  Is being followed by Dr. Tedra Kerr at [facility].  Patient does not have weakness and is able to ambulate unassisted.  She is independent of ADLs and is able to help with light house cleaning and meal prep.  She does not drive.  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  She is types up notes for her provider visits and uses a white board to communicate.  She does have excessive drooling and uses suctioning to help keep secretions cleared.  Has tried glycopyrrolate in the past and this made her dizzy.  Recommend possibly trying atropine ophthalmic solution 1% which can be used 4 drops sublingually QID PRN for secretions.  Will reach out to Dr. Vallarie Kerr with this recommendation.  Continue follow up and recommendations with Dr. Vallarie Kerr  3.  Neuropathy.  Patient has neuropathy pain in shoulders and neck.  Was started on amitriptyline 25 mg take 2 tabs at night.  States that she has been taking one tab at night when needed with good results.  States that she gets the pain about 3 days a week and rates it an 8/10 when it occurs and that it stays with her all day.  Today states that she is pain free.  Continue amitriptyline as ordered and continue to monitor for effectiveness.  4.  Appetite.  Patient does have dysphagia and vocal cord dysfunction.  She eats all her foods pureed. She tries to eat a keto or paleo diet to get plenty of protein.  She supplements with a supplemental drink 3 times a day.  She has PEG tube but  currently takes all nutrition orally as long as it is pureed.  She has pre packaged pureed meals that she buys and will also puree her own food.  She had lost down to 118 pounds in February this year and today weighs 121.5 pounds.  She is having slow weight gain but is regaining weight.  States that last night she had an episode of vomiting and diarrhea but feels fine today.  Denies abdominal pain, cramping, fever, hemoptysis, hematochezia.  Encouraged to continue to keep the PEG tubed flushed with water daily for patency.  Has stated that a couple of times she has had wetness around the tube site.  Will continue to monitor and refer to GI if needed.  Patient is stable at this time and is doing well with puree diet.  Palliative will continue to monitor for symptom management/decline and make recommendations as needed.  Next appointment is in 4 weeks.  Encouraged to call with changes, concerns, or questions.  I spent 120 minutes providing this consultation,  from 3:30 to 5:30 including time spent with patient/family, chart review, provider coordination, documentation. More than 50% of the time in this consultation was spent coordinating communication.   HISTORY OF PRESENT ILLNESS:  Autumn Kerr is a 79 y.o. year old female with multiple medical problems including ALS, HTN, anxiety, neuropathy, h/o skin cancer . Palliative Care  was asked to help address goals of care.   CODE STATUS: see above  PPS: 60% HOSPICE ELIGIBILITY/DIAGNOSIS: TBD  PHYSICAL EXAM:  BP 157/81  HR 66  O2 99% on RA  Weight 121.5 pounds General: NAD, frail appearing, thin Extremities: no edema, no joint deformities Skin: no rashes on exposed skin Neurological: patient has dysphagia and is nonverbal   PAST MEDICAL HISTORY:  Past Medical History:  Diagnosis Date  . Anxiety   . Arthritis   . Cancer ([place])    malignant melanoma of skin  . Diabetes mellitus without complication ([place])   . GERD (gastroesophageal reflux disease)    . Hypercholesterolemia   . Hyperlipemia   . Hypertension   . Neuropathy   . Osteoarthritis   . Osteopenia   . Thyroid disease   . Vitamin D deficiency   . Vocal cord dysfunction     SOCIAL HX:  Social History   Tobacco Use  . Smoking status: Never Smoker  . Smokeless tobacco: Never Used  Substance Use Topics  . Alcohol use: Yes    Comment: occ    ALLERGIES:  Allergies  Allergen Reactions  . Levothyroxine Other (See Comments)    intolerance  . Lisinopril Cough  . Prednisone      PERTINENT MEDICATIONS:  Outpatient Encounter Medications as of 01/10/2020  Medication Sig  . atorvastatin (LIPITOR) 20 MG tablet Take 1 tablet (20 mg total) by mouth daily at 6 PM.  . losartan (COZAAR) 25 MG tablet Take 3 tablets (75 mg total) by mouth daily. (Patient taking differently: Take 25 mg by mouth daily. )  . metoprolol tartrate (LOPRESSOR) 25 MG tablet Take 1 tablet (25 mg total) by mouth 2 (two) times daily.  . Multiple Vitamin (MULTIVITAMIN WITH MINERALS) TABS tablet Take 1 tablet by mouth daily.  . ranitidine (ZANTAC) 150 MG tablet Take 150 mg by mouth 2 (two) times daily.  Marland Kitchen TIROSINT 125 MCG CAPS Take 125 mcg by mouth daily.  . traMADol (ULTRAM) 50 MG tablet Take 1 tablet (50 mg total) by mouth every 6 (six) hours as needed.   No facility-administered encounter medications on file as of 01/10/2020.     Autumn Mehl Jenetta Downer, NP

## 2020-01-16 ENCOUNTER — Telehealth: Payer: Self-pay | Admitting: Adult Health Nurse Practitioner

## 2020-01-16 NOTE — Telephone Encounter (Signed)
Spoke with Helene Kelp at [facility] working with Dr. Vallarie Mare.  Gave me correct fax number to fax my note over of [phone].  Faxed over my note from 01/10/20 to her. Lynnie Koehler K. Olena Heckle NP

## 2020-02-15 ENCOUNTER — Other Ambulatory Visit: Payer: Medicare Other | Admitting: Adult Health Nurse Practitioner

## 2020-02-15 ENCOUNTER — Other Ambulatory Visit: Payer: Self-pay

## 2020-02-15 DIAGNOSIS — G1221 Amyotrophic lateral sclerosis: Secondary | ICD-10-CM

## 2020-02-15 DIAGNOSIS — Z515 Encounter for palliative care: Secondary | ICD-10-CM

## 2020-02-15 NOTE — Progress Notes (Signed)
[facility] Consult Note Telephone: [phone]  Fax: [phone]  PATIENT NAME: Autumn Kerr DOB: 25-Jun-1941 MRN: [id number]  PRIMARY CARE PROVIDER:   Kirk Ruths, MD  REFERRING PROVIDER:  Dr. Tedra Coupe  RESPONSIBLE PARTY:   Shauntea Lok, son     [phone]    RECOMMENDATIONS and PLAN:  1.  Advanced care planning.  Patient is DNR.  Filled out MOST form today for DNR, comfort measures, antibiotics as indicated, IV fluids for trial period.  Patient currently has PEG tube in place which she only uses as needed.  Currently is taking all nutrition orally.  2.  ALS.  Is being followed by Dr. Tedra Coupe at [facility].  Patient does not have weakness and is able to ambulate unassisted.  She is independent of ADLs and is able to help with light house cleaning and meal prep.  She does not drive.  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  Uses Cuvposa for excessive drooling with relief.  Patient has no new concerns today.  Does state having some shortness of breath sometimes at rest but mostly when she tries working fast.  Denies cough, fever, nausea, vomiting, diarrhea, constipation, chest dizziness, weakness.  Patient has not had any falls, infection, hospital visits since last visit.  Continue follow-up and recommendations with Dr. Vallarie Mare  3.  Neuropathy.  Patient has neuropathy pain in shoulders and neck.  Was started on amitriptyline 25 mg take 2 tabs at night.  States that she has been taking one tab at night when needed with good results. Continue amitriptyline as ordered and continue to monitor for effectiveness.  Has been keeping tight tube flushed.  Current weight is 122 pounds  Patient is stable and doing well.  Palliative will continue to monitor for symptom management/decline and make recommendations as needed we will call in 8 weeks to see if follow-up visit is needed encouraged to call with changes concerns or  questions  4.  Appetite.  Patient does have dysphagia and vocal cord dysfunction.  She eats all her foods pureed. She tries to eat a keto or paleo diet to get plenty of protein.  She supplements with a supplemental drink 3 times a day.  She has PEG tube but currently takes all nutrition orally as long as it is pureed.   I spent 60 minutes providing this consultation,  from 10:00 to 11:00 including time spent with patient/family, chart review, provider coordination, documentation. More than 50% of the time in this consultation was spent coordinating communication.   HISTORY OF PRESENT ILLNESS:  Autumn Kerr is a 79 y.o. year old female with multiple medical problems including ALS, HTN, anxiety, neuropathy, h/o skin cancer . Palliative Care was asked to help address goals of care.   CODE STATUS: DNR  PPS: 60% HOSPICE ELIGIBILITY/DIAGNOSIS: TBD  PHYSICAL EXAM:   General: NAD, frail appearing, thin Extremities: no edema, no joint deformities Skin: no rashes on exposed skin Neurological: patient has dysphagia and is nonverbal   PAST MEDICAL HISTORY:  Past Medical History:  Diagnosis Date  . Anxiety   . Arthritis   . Cancer ([place])    malignant melanoma of skin  . Diabetes mellitus without complication ([place])   . GERD (gastroesophageal reflux disease)   . Hypercholesterolemia   . Hyperlipemia   . Hypertension   . Neuropathy   . Osteoarthritis   . Osteopenia   . Thyroid disease   . Vitamin D  deficiency   . Vocal cord dysfunction     SOCIAL HX:  Social History   Tobacco Use  . Smoking status: Never Smoker  . Smokeless tobacco: Never Used  Substance Use Topics  . Alcohol use: Yes    Comment: occ    ALLERGIES:  Allergies  Allergen Reactions  . Levothyroxine Other (See Comments)    intolerance  . Lisinopril Cough  . Prednisone      PERTINENT MEDICATIONS:  Outpatient Encounter Medications as of 02/14/2077  Medication Sig  . atorvastatin (LIPITOR) 20 MG tablet Take 1  tablet (20 mg total) by mouth daily at 6 PM.  . losartan (COZAAR) 25 MG tablet Take 3 tablets (75 mg total) by mouth daily. (Patient taking differently: Take 25 mg by mouth daily. )  . metoprolol tartrate (LOPRESSOR) 25 MG tablet Take 1 tablet (25 mg total) by mouth 2 (two) times daily.  . Multiple Vitamin (MULTIVITAMIN WITH MINERALS) TABS tablet Take 1 tablet by mouth daily.  . ranitidine (ZANTAC) 150 MG tablet Take 150 mg by mouth 2 (two) times daily.  Marland Kitchen TIROSINT 125 MCG CAPS Take 125 mcg by mouth daily.  . traMADol (ULTRAM) 50 MG tablet Take 1 tablet (50 mg total) by mouth every 6 (six) hours as needed.   No facility-administered encounter medications on file as of 02/15/2020.     [name], NP

## 2020-04-22 ENCOUNTER — Telehealth: Payer: Self-pay | Admitting: Adult Health Nurse Practitioner

## 2020-04-22 NOTE — Telephone Encounter (Signed)
Called son to see how his mother was doing and if she needed a visit.  Left VM with reason for call and call back info Drenda Sobecki K. Olena Heckle NP

## 2020-05-06 ENCOUNTER — Telehealth: Payer: Self-pay | Admitting: Adult Health Nurse Practitioner

## 2020-05-06 NOTE — Telephone Encounter (Signed)
Returned son's VM.  Patient having more difficulty swallowing and is having difficulty working her feeding tube by herself.  Her son does help with the feedings when he is there.  Will reach out to Dr. Vallarie Mare' office for possible order for home health RN to help educate her on how to use the tube so she will get more comfortable using it herself.  Set up appointment for 05/10/20 at 12pm Catilyn Boggus K. Olena Heckle NP

## 2020-05-10 ENCOUNTER — Other Ambulatory Visit: Payer: Medicare Other | Admitting: Adult Health Nurse Practitioner

## 2020-05-10 ENCOUNTER — Other Ambulatory Visit: Payer: Self-pay

## 2020-05-10 DIAGNOSIS — G1221 Amyotrophic lateral sclerosis: Secondary | ICD-10-CM

## 2020-05-10 DIAGNOSIS — Z515 Encounter for palliative care: Secondary | ICD-10-CM

## 2020-05-10 NOTE — Progress Notes (Addendum)
[place] [facility] Consult Note Telephone: [phone]  Fax: [phone]  PATIENT NAME: Autumn Kerr DOB: 1941-01-25 MRN: [id number]  PRIMARY CARE PROVIDER:   Kirk Ruths, MD  REFERRING PROVIDER:  Dr. Tedra Coupe  RESPONSIBLE PARTY:   Meryl Crutch, [phone]    RECOMMENDATIONS and PLAN:  1.  Advanced care planning.  Patient is DNR.  Filled out MOST form today for DNR, comfort measures, antibiotics as indicated, IV fluids for trial period.  2.  ALS. Is being followed by Dr. Tedra Coupe at [facility]. Patient does not have weakness and is able to ambulate unassisted. She is independent of ADLs and is able to help with light house cleaning and meal prep. She does not drive. 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  Uses Cuvposa for excessive drooling with relief. Has riluzole for her ALS but has not been taking this.  Denies cough, fever, nausea, vomiting, diarrhea, constipation, chest pain, headaches, dizziness, weakness.  Patient has not had any falls, infection, hospital visits since last visit.  Continue follow-up and recommendations with Dr. Vallarie Mare  3.  Pain.  Patient has neuropathy and has been getting relief with amitriptyline.  States that she has not been getting relief from the amitriptyline lately and feels achy all over.  She has tried tramadol in the past.  Ordered tramadol 50mg  Q 12 hrs PRN for pain. Checked patient on [place] substance abuse website. Instructed on side effects.  Instructed to start half a tab (25mg ) and if ineffective can increase to whole tab.    4. Nutritional status.  Patient having increased difficulty swallowing and is now taking all nutrition and meds through PEG tube.  She uses pureed foods and dilutes them when putting through tube.  Does flushes.  Son had called earlier this week wanting more instruction for his mother to use the PEG tube as she was uncomfortable with using it.   He helps when he is there.  She did use tube today while provider there and seemed comfortable with it. Did need reminded a few times to hold it up high enough for gravity to drain the contents.  Does have home health RN who is coming today to provide further instruction.  Patient had lost down to 114 pounds but has been trying to increase intake.  Today weighs 117.  Palliative will continue to monitor for symptom management/decline and make recommendations as needed.  Have appointment in 3 weeks.  Will call next week to evaluate effectiveness of tramadol.   I spent 60 minutes providing this consultation,  from 12:00 to 1:00 including time spent with patient/family, chart review, provider coordination, documentation. More than 50% of the time in this consultation was spent coordinating communication.   HISTORY OF PRESENT ILLNESS:  Autumn Kerr is a 79 y.o. year old female with multiple medical problems including ALS,HTN, anxiety, neuropathy, h/o skin cancer. Palliative Care was asked to help address goals of care.   CODE STATUS: DNR  PPS: 60% HOSPICE ELIGIBILITY/DIAGNOSIS: TBD  PHYSICAL EXAM:  BP 148/82  HR  65  O2 98% on RA General: NAD, frail appearing, thin Cardiovascular: regular rate and rhythm Pulmonary: lung sounds clear; normal respiratory effort Abdomen: soft, nontender, + bowel sounds;  PEG site clean with no erythema or drainage noted GU: no suprapubic tenderness Extremities: no edema, no joint deformities Skin: no rashes on exposed skin;  Neurological: patient has dysphagia and is nonverbal   PAST MEDICAL HISTORY:  Past Medical History:  Diagnosis Date  . Anxiety   . Arthritis   . Cancer ([place])    malignant melanoma of skin  . Diabetes mellitus without complication ([place])   . GERD (gastroesophageal reflux disease)   . Hypercholesterolemia   . Hyperlipemia   . Hypertension   . Neuropathy   . Osteoarthritis   . Osteopenia   . Thyroid disease   . Vitamin D  deficiency   . Vocal cord dysfunction     SOCIAL HX:  Social History   Tobacco Use  . Smoking status: Never Smoker  . Smokeless tobacco: Never Used  Substance Use Topics  . Alcohol use: Yes    Comment: occ    ALLERGIES:  Allergies  Allergen Reactions  . Levothyroxine Other (See Comments)    intolerance  . Lisinopril Cough  . Prednisone      PERTINENT MEDICATIONS:  Outpatient Encounter Medications as of 05/10/2020  Medication Sig  . atorvastatin (LIPITOR) 20 MG tablet Take 1 tablet (20 mg total) by mouth daily at 6 PM.  . losartan (COZAAR) 25 MG tablet Take 3 tablets (75 mg total) by mouth daily. (Patient taking differently: Take 25 mg by mouth daily. )  . metoprolol tartrate (LOPRESSOR) 25 MG tablet Take 1 tablet (25 mg total) by mouth 2 (two) times daily.  . Multiple Vitamin (MULTIVITAMIN WITH MINERALS) TABS tablet Take 1 tablet by mouth daily.  . ranitidine (ZANTAC) 150 MG tablet Take 150 mg by mouth 2 (two) times daily.  Marland Kitchen TIROSINT 125 MCG CAPS Take 125 mcg by mouth daily.  . traMADol (ULTRAM) 50 MG tablet Take 1 tablet (50 mg total) by mouth every 6 (six) hours as needed.   No facility-administered encounter medications on file as of 05/10/2020.     Temeka Pore Jenetta Downer, NP

## 2020-05-27 ENCOUNTER — Other Ambulatory Visit: Payer: Medicare Other | Admitting: Adult Health Nurse Practitioner

## 2020-05-27 ENCOUNTER — Other Ambulatory Visit: Payer: Self-pay

## 2020-05-27 DIAGNOSIS — Z515 Encounter for palliative care: Secondary | ICD-10-CM

## 2020-05-27 DIAGNOSIS — G1221 Amyotrophic lateral sclerosis: Secondary | ICD-10-CM

## 2020-05-27 NOTE — Progress Notes (Signed)
[place] Consult Note Telephone: [phone]  Fax: [phone]  PATIENT NAME: Autumn Kerr DOB: 12-14-1940 MRN: [id number]  PRIMARY CARE PROVIDER:   Kirk Ruths, MD  REFERRING PROVIDER:  Dr. Tedra Kerr  RESPONSIBLE PARTY:Autumn Kerr, [phone]   RECOMMENDATIONS and PLAN: 1.Advanced care planning.Patient is DNR.  2.  ALS. Is being followed by Dr. Tedra Kerr at [facility]. Patient does not have weakness and is able to ambulate unassisted. She is independent of ADLs and is able to help with light house cleaning and meal prep. She does not drive. 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  States that riluzole is too strong for her stomach.  Advised that she trying taking it with food to decrease stomach upset.  Denies cough, fever, nausea, vomiting, diarrhea, constipation, chest pain, headaches, dizziness, weakness. Patient has not had any falls, infection, hospital visits since last visit. Continue follow-up and recommendations with Dr. Vallarie Kerr  2.  Pain.  Patient states that she has been taking amitriptyline 25 mg for her body pain with relief.  Has not had to take the tramadol.  Continue current pain regimen.  3.  Nutritional status.  Patient gets all nutrition and meds through PEG tube.  Has had instruction through home health nurse and is doing well with it. She has not had any problems with the PEG tube.  Patient weighs herself 2x a day and usually weighs 111-116.  Patient has not gained much but is maintaining her weight.  Continue to monitor weight for improvement.    Patient is stable but at risk for decline with her neurological disorder. Palliative will continue to monitor for symptom management/decline and make recommendations as needed.  Have appointment in 10 weeks. Encouraged to call with any questions or concerns   I spent  50 minutes providing this consultation,  from 12:00 to 12:50 including time spent with patient/family, chart review, provider coordination, documentation. More than 50% of the time in this consultation was spent coordinating communication.   HISTORY OF PRESENT ILLNESS:  Autumn Kerr is a 79 y.o. year old female with multiple medical problems including ALS,HTN, anxiety, neuropathy, h/o skin cancer. Palliative Care was asked to help address goals of care.   CODE STATUS: DNR  PPS: 60% HOSPICE ELIGIBILITY/DIAGNOSIS: TBD  PHYSICAL EXAM:  BP 134/78  HR  77  O2 98% on RA General: NAD, frail appearing, thin Cardiovascular: regular rate and rhythm Pulmonary: lung sounds clear; normal respiratory effort Abdomen: soft, nontender, + bowel sounds;  PEG site clean with no erythema or drainage noted GU: no suprapubic tenderness Extremities: no edema, no joint deformities Skin: no rashes on exposed skin;  Neurological: patient has dysphagia and is nonverbal   PAST MEDICAL HISTORY:  Past Medical History:  Diagnosis Date   Anxiety    Arthritis    Cancer ([place])    malignant melanoma of skin   Diabetes mellitus without complication ([facility])    GERD (gastroesophageal reflux disease)    Hypercholesterolemia    Hyperlipemia    Hypertension    Neuropathy    Osteoarthritis    Osteopenia    Thyroid disease    Vitamin D deficiency    Vocal cord dysfunction     SOCIAL HX:  Social History   Tobacco Use   Smoking status: Never Smoker   Smokeless tobacco: Never Used  Substance Use Topics   Alcohol use: Yes    Comment:  occ    ALLERGIES:  Allergies  Allergen Reactions   Levothyroxine Other (See Comments)    intolerance   Lisinopril Cough   Prednisone      PERTINENT MEDICATIONS:  Outpatient Encounter Medications as of 05/27/2020  Medication Sig   atorvastatin (LIPITOR) 20 MG tablet Take 1 tablet (20 mg total) by mouth daily at 6 PM.   losartan (COZAAR) 25 MG tablet Take  3 tablets (75 mg total) by mouth daily. (Patient taking differently: Take 25 mg by mouth daily. )   metoprolol tartrate (LOPRESSOR) 25 MG tablet Take 1 tablet (25 mg total) by mouth 2 (two) times daily.   Multiple Vitamin (MULTIVITAMIN WITH MINERALS) TABS tablet Take 1 tablet by mouth daily.   ranitidine (ZANTAC) 150 MG tablet Take 150 mg by mouth 2 (two) times daily.   TIROSINT 125 MCG CAPS Take 125 mcg by mouth daily.   traMADol (ULTRAM) 50 MG tablet Take 1 tablet (50 mg total) by mouth every 6 (six) hours as needed.   No facility-administered encounter medications on file as of 05/27/2020.     Autumn Kerr Autumn Downer, NP

## 2020-08-01 ENCOUNTER — Emergency Department
Admission: EM | Admit: 2020-08-01 | Discharge: 2020-08-01 | Disposition: A | Discharge status: Home / Self Care | Payer: Medicare Other | Attending: Emergency Medicine | Admitting: Emergency Medicine

## 2020-08-01 ENCOUNTER — Emergency Department: Payer: Medicare Other

## 2020-08-01 ENCOUNTER — Other Ambulatory Visit: Payer: Self-pay

## 2020-08-01 DIAGNOSIS — Z85828 Personal history of other malignant neoplasm of skin: Secondary | ICD-10-CM | POA: Diagnosis not present

## 2020-08-01 DIAGNOSIS — G1221 Amyotrophic lateral sclerosis: Secondary | ICD-10-CM

## 2020-08-01 DIAGNOSIS — W01198A Fall on same level from slipping, tripping and stumbling with subsequent striking against other object, initial encounter: Secondary | ICD-10-CM | POA: Diagnosis not present

## 2020-08-01 DIAGNOSIS — Z23 Encounter for immunization: Secondary | ICD-10-CM | POA: Diagnosis not present

## 2020-08-01 DIAGNOSIS — S0003XA Contusion of scalp, initial encounter: Secondary | ICD-10-CM | POA: Insufficient documentation

## 2020-08-01 DIAGNOSIS — S300XXA Contusion of lower back and pelvis, initial encounter: Secondary | ICD-10-CM | POA: Diagnosis not present

## 2020-08-01 DIAGNOSIS — S40011A Contusion of right shoulder, initial encounter: Secondary | ICD-10-CM | POA: Diagnosis not present

## 2020-08-01 DIAGNOSIS — S7001XA Contusion of right hip, initial encounter: Secondary | ICD-10-CM

## 2020-08-01 DIAGNOSIS — W19XXXA Unspecified fall, initial encounter: Secondary | ICD-10-CM

## 2020-08-01 DIAGNOSIS — S0990XA Unspecified injury of head, initial encounter: Secondary | ICD-10-CM | POA: Diagnosis present

## 2020-08-01 DIAGNOSIS — I10 Essential (primary) hypertension: Secondary | ICD-10-CM | POA: Diagnosis not present

## 2020-08-01 MED ORDER — TETANUS-DIPHTH-ACELL PERTUSSIS 5-2.5-18.5 LF-MCG/0.5 IM SUSY
0.5000 mL | PREFILLED_SYRINGE | Freq: Once | INTRAMUSCULAR | Status: AC
  Administered 2020-08-01: 0.5 mL via INTRAMUSCULAR
  Filled 2020-08-01: qty 0.5

## 2020-08-01 MED ORDER — MAGNESIUM CITRATE PO SOLN: 1.0000 | Freq: Once | ORAL | 0 refills | Status: AC

## 2020-08-01 MED ORDER — SENNOSIDES-DOCUSATE SODIUM 8.6-50 MG PO TABS: 1.0000 | ORAL_TABLET | Freq: Every day | ORAL | 0 refills | Status: AC

## 2020-08-01 NOTE — ED Triage Notes (Signed)
Pt to ED via [facility] for chief complaint of fall hitting back of head, no blood thinner use. + LOC  Pt non verbal from ALS, using white board to communicate.   C/o heavy leg feeling, arm and back pain  Laceration noted to back of head, bleeding controlled at this time

## 2020-08-01 NOTE — ED Provider Notes (Signed)
[facility] Emergency Department Provider Note  ____________________________________________  Time seen: Approximately 4:52 PM  I have reviewed the triage vital signs and the nursing notes.   HISTORY  Chief Complaint Fall  Patient with a history of bulbar ALS. Patient is nonverbal but is able to communicate by writing. Patient son is also present, helps with further history.  HPI Autumn Kerr is a 79 y.o. female who presents the emergency department via EMS after a fall. Patient does have a history of ALS, has weakness and has had repetitive falls. Patient was trying to get out of her chair, lost her balance and fell backwards striking her head. This is the second fall in 2 days. Today patient hit her head, sustained a laceration to the occipital skull. There was no reported loss of consciousness. She denies any headache or visual changes. Patient communicates that she is having neck pain, right shoulder pain, low back pain, tailbone pain and right hip pain. Patient is ambulatory at baseline, but does have weakness from ALS. Further medical history as described below. Patient denied any other complaints of fevers, chills, nasal congestion, sore throat, coughing, chest pain, abdominal pain, nausea vomiting, diarrhea constipation, urinary symptoms.         Past Medical History:  Diagnosis Date  . Anxiety   . Arthritis   . Cancer ([place])    malignant melanoma of skin  . Diabetes mellitus without complication ([place])   . GERD (gastroesophageal reflux disease)   . Hypercholesterolemia   . Hyperlipemia   . Hypertension   . Neuropathy   . Osteoarthritis   . Osteopenia   . Thyroid disease   . Vitamin D deficiency   . Vocal cord dysfunction     Patient Active Problem List   Diagnosis Date Noted  . 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 EYE SURGERY Right    cataract    Prior to Admission medications    Medication Sig Start Date End Date Taking? Authorizing Provider  atorvastatin (LIPITOR) 20 MG tablet Take 1 tablet (20 mg total) by mouth daily at 6 PM. 10/04/17   Gouru, Aruna, MD  losartan (COZAAR) 25 MG tablet Take 3 tablets (75 mg total) by mouth daily. Patient taking differently: Take 25 mg by mouth daily.  09/28/17   Rudene Re, MD  magnesium citrate SOLN Take 296 mLs (1 Bottle total) by mouth once for 1 dose. 08/01/20 08/01/20  Zadia Uhde, Charline Bills, PA-C  metoprolol tartrate (LOPRESSOR) 25 MG tablet Take 1 tablet (25 mg total) by mouth 2 (two) times daily. 10/04/17 10/04/18  Nicholes Mango, MD  Multiple Vitamin (MULTIVITAMIN WITH MINERALS) TABS tablet Take 1 tablet by mouth daily. 10/05/17   Nicholes Mango, MD  ranitidine (ZANTAC) 150 MG tablet Take 150 mg by mouth 2 (two) times daily. 09/27/17   [provider]  senna-docusate (SENOKOT-S) 8.6-50 MG tablet Take 1 tablet by mouth daily. 08/01/20   Tarrence Enck, Charline Bills, PA-C  TIROSINT 125 MCG CAPS Take 125 mcg by mouth daily. 08/13/17   [provider]  traMADol (ULTRAM) 50 MG tablet Take 1 tablet (50 mg total) by mouth every 6 (six) hours as needed. 10/15/19   Triplett, Johnette Abraham B, FNP    Allergies Levothyroxine, Lisinopril, and Prednisone  No family history on file.  Social History Social History   Tobacco Use  . Smoking status: Never Smoker  . Smokeless tobacco: Never Used  Substance Use Topics  . Alcohol  use: Yes    Comment: occ  . Drug use: No     Review of Systems  Constitutional: No fever/chills Eyes: No visual changes. No discharge ENT: No upper respiratory complaints. Cardiovascular: no chest pain. Respiratory: no cough. No SOB. Gastrointestinal: No abdominal pain.  No nausea, no vomiting.  No diarrhea.  No constipation. Musculoskeletal: Positive for right shoulder, lower back, tailbone pain, right hip pain Skin: Positive for scalp laceration Neurological: Negative for headaches, focal weakness or  numbness.  10 System ROS otherwise negative.  ____________________________________________   PHYSICAL EXAM:  VITAL SIGNS: ED Triage Vitals  Enc Vitals Group     BP      Pulse      Resp      Temp      Temp src      SpO2      Weight      Height      Head Circumference      Peak Flow      Pain Score      Pain Loc      Pain Edu?      Excl. in [place]?      Constitutional: Alert and oriented. Well appearing and in no acute distress. Eyes: Conjunctivae are normal. PERRL. EOMI. Head: Atraumatic. ENT:      Ears:       Nose: No congestion/rhinnorhea.      Mouth/Throat: Mucous membranes are moist.  Neck: No stridor. Mild midline cervical spine tenderness to palpation over the C6 prominence. No palpable abnormality or step-off. Patient does have bilateral paraspinal muscle tenderness in this region as well. Radial pulses sensation intact and equal bilateral upper extremities.  Cardiovascular: Normal rate, regular rhythm. Normal S1 and S2.  Good peripheral circulation. Respiratory: Normal respiratory effort without tachypnea or retractions. Lungs CTAB. Good air entry to the bases with no decreased or absent breath sounds. Gastrointestinal: Bowel sounds 4 quadrants. Soft and nontender to palpation. No guarding or rigidity. No palpable masses. No distention. No CVA tenderness. Musculoskeletal: Full range of motion to all extremities. No gross deformities appreciated. Examination of the right shoulder reveals mild tenderness over the lateral aspect of the scapula and scapular ridge. No palpable abnormalities in this area. Mild tenderness over the humeral head. No other tenderness to palpation of the right shoulder. Good range of motion to the right shoulder at this time. Patient is using the right upper extremity to communicate by writing. Remainder of exam to the right upper extremity is unremarkable. Radial pulses sensation intact distally. Examination of the lumbar spine reveals diffuse  tenderness through the L4 region diffusely into the sacral/coccyx region. No palpable abnormality or step-off. Examination of the left hip and lower extremity is unremarkable. Examination of the right hip reveals tenderness along the posterior pelvic crest extending into the lateral hip. There is no palpable abnormality or deficit. Patient does have good range of motion. There is no shortening or rotation of the right lower extremity. No anterior hip pain, no groin tenderness to palpation. Examination of the femur, knee, remainder of right lower extremity is unremarkable. Dorsalis pedis pulse and sensation intact distally. Neurologic:  Normal speech and language. No gross focal neurologic deficits are appreciated. Cranial nerves II through XII grossly intact. Skin:  Skin is warm, dry and intact. No rash noted. Psychiatric: Mood and affect are normal. Patient is nonverbal secondary to ALS. She communicates by writing. Behavior is normal at this time. Patient exhibits appropriate insight and judgement.   ____________________________________________  LABS (all labs ordered are listed, but only abnormal results are displayed)  Labs Reviewed - No data to display ____________________________________________  EKG   ____________________________________________  RADIOLOGY I personally viewed and evaluated these images as part of my medical decision making, as well as reviewing the written report by the radiologist.  ED Provider Interpretation:   DG Lumbar Spine 2-3 Views  Result Date: 08/01/2020 CLINICAL DATA:  79 year old who fell earlier today and landed on her RIGHT side. Current history of ALS. Initial encounter. EXAM: LUMBAR SPINE - 2-3 VIEW COMPARISON:  None. FINDINGS: Five non-rib-bearing lumbar vertebrae with anatomic alignment. Osseous demineralization. Straightening of the usual lumbar lordosis. No fractures. Mild disc space narrowing and endplate hypertrophic changes at L1-2, L3-4 and  L4-5. Moderate disc space narrowing and endplate hypertrophic changes at L5-S1. Note is made of a very large amount of stool in the visualized colon, including the rectum. Gastrostomy tube tip projects over the body of the stomach. IMPRESSION: 1. No acute osseous abnormality. 2. Straightening of the usual lordosis which may reflect positioning and/or spasm. 3. Multilevel degenerative disc disease and spondylosis, greatest at L5-S1. 4. Incidental note of a large colonic stool burden and possible rectal fecal impaction. Electronically Signed   By: Evangeline Dakin M.D.   On: 08/01/2020 18:07   DG Sacrum/Coccyx  Result Date: 08/01/2020 CLINICAL DATA:  79 year old who fell earlier today and landed on her RIGHT side. Current history of ALS. Initial encounter. EXAM: SACRUM AND COCCYX - 2+ VIEW COMPARISON:  None. FINDINGS: Osseous demineralization. No evidence of acute fracture involving the sacrum or coccyx. Sacroiliac joints anatomically aligned with mild degenerative changes. Note is made of a large amount of stool in the rectum, query fecal impaction. IMPRESSION: 1. No evidence of acute fracture involving the sacrum or coccyx. 2. Osseous demineralization. 3. Possible rectal fecal impaction. Electronically Signed   By: Evangeline Dakin M.D.   On: 08/01/2020 18:08   DG Shoulder Right  Result Date: 08/01/2020 CLINICAL DATA:  79 year old who fell earlier today and landed on her RIGHT side. Current history of ALS. Initial encounter. EXAM: RIGHT SHOULDER - 2+ VIEW COMPARISON:  No prior RIGHT shoulder imaging. FINDINGS: Osseous demineralization. No evidence of acute fracture. Glenohumeral joint anatomically aligned with a well-preserved joint space. Subacromial space well-preserved. Acromioclavicular joint anatomically aligned with mild degenerative changes. Incidental note of calcifications involving the base of the RIGHT lung which are stable over multiple prior chest x-rays. IMPRESSION: 1. No acute osseous  abnormality. 2. Osseous demineralization. 3. Mild degenerative changes involving the acromioclavicular joint. Electronically Signed   By: Evangeline Dakin M.D.   On: 08/01/2020 18:04   CT Head Wo Contrast  Result Date: 08/01/2020 CLINICAL DATA:  Status post fall.  Loss of consciousness. EXAM: CT HEAD WITHOUT CONTRAST CT CERVICAL SPINE WITHOUT CONTRAST TECHNIQUE: Multidetector CT imaging of the head and cervical spine was performed following the standard protocol without intravenous contrast. Multiplanar CT image reconstructions of the cervical spine were also generated. COMPARISON:  CT head and C-spine 10/15/2019. FINDINGS: CT HEAD FINDINGS Brain: Cerebral ventricle sizes are concordant with the degree of cerebral volume loss. Patchy and confluent areas of decreased attenuation are noted throughout the deep and periventricular white matter of the cerebral hemispheres bilaterally, compatible with chronic microvascular ischemic disease. No evidence of large-territorial acute infarction. No parenchymal hemorrhage. No mass lesion. No extra-axial collection. No mass effect or midline shift. No hydrocephalus. Basilar cisterns are patent. Vascular: No hyperdense vessel. Atherosclerotic calcifications are present within the cavernous internal  carotid and vertebral arteries. Skull: No acute fracture or focal lesion. Sinuses/Orbits: Paranasal sinuses and mastoid air cells are clear. The orbits are unremarkable. Other: A 0.3 cm left occipitotemporal subgaleal hematoma. CT CERVICAL SPINE FINDINGS Alignment: Grade 1 anterolisthesis of C7 on T1. Skull base and vertebrae: Multilevel degenerative changes of the spine worse at the C5-C6 levels. No acute fracture. No aggressive appearing focal osseous lesion or focal pathologic process. Soft tissues and spinal canal: No prevertebral fluid or swelling. No visible canal hematoma. Disc levels: Intervertebral disc space narrowing worse at the C5-C6 levels. Upper chest: Unremarkable.  Other: None. IMPRESSION: 1. No acute intracranial abnormality in a patient with a trace left occipitotemporal subgaleal hematoma. 2. No acute displaced fracture or traumatic listhesis of the cervical spine. Electronically Signed   By: Iven Finn M.D.   On: 08/01/2020 18:15   CT Cervical Spine Wo Contrast  Result Date: 08/01/2020 CLINICAL DATA:  Status post fall.  Loss of consciousness. EXAM: CT HEAD WITHOUT CONTRAST CT CERVICAL SPINE WITHOUT CONTRAST TECHNIQUE: Multidetector CT imaging of the head and cervical spine was performed following the standard protocol without intravenous contrast. Multiplanar CT image reconstructions of the cervical spine were also generated. COMPARISON:  CT head and C-spine 10/15/2019. FINDINGS: CT HEAD FINDINGS Brain: Cerebral ventricle sizes are concordant with the degree of cerebral volume loss. Patchy and confluent areas of decreased attenuation are noted throughout the deep and periventricular white matter of the cerebral hemispheres bilaterally, compatible with chronic microvascular ischemic disease. No evidence of large-territorial acute infarction. No parenchymal hemorrhage. No mass lesion. No extra-axial collection. No mass effect or midline shift. No hydrocephalus. Basilar cisterns are patent. Vascular: No hyperdense vessel. Atherosclerotic calcifications are present within the cavernous internal carotid and vertebral arteries. Skull: No acute fracture or focal lesion. Sinuses/Orbits: Paranasal sinuses and mastoid air cells are clear. The orbits are unremarkable. Other: A 0.3 cm left occipitotemporal subgaleal hematoma. CT CERVICAL SPINE FINDINGS Alignment: Grade 1 anterolisthesis of C7 on T1. Skull base and vertebrae: Multilevel degenerative changes of the spine worse at the C5-C6 levels. No acute fracture. No aggressive appearing focal osseous lesion or focal pathologic process. Soft tissues and spinal canal: No prevertebral fluid or swelling. No visible canal  hematoma. Disc levels: Intervertebral disc space narrowing worse at the C5-C6 levels. Upper chest: Unremarkable. Other: None. IMPRESSION: 1. No acute intracranial abnormality in a patient with a trace left occipitotemporal subgaleal hematoma. 2. No acute displaced fracture or traumatic listhesis of the cervical spine. Electronically Signed   By: Iven Finn M.D.   On: 08/01/2020 18:15   DG Hip Unilat W or Wo Pelvis 2-3 Views Right  Result Date: 08/01/2020 CLINICAL DATA:  79 year old who fell earlier today and landed on her RIGHT side. Current history of ALS. Initial encounter. EXAM: DG HIP (WITH OR WITHOUT PELVIS) 2-3V RIGHT COMPARISON:  None. FINDINGS: Mild osseous demineralization. No evidence of acute fracture or dislocation. Well-preserved joint space. Included AP pelvis demonstrates a well-preserved joint space in the contralateral LEFT hip. Symphysis pubis anatomically aligned without significant degenerative changes. Degenerative changes are present in the visualized lower lumbar spine. IMPRESSION: 1. No acute osseous abnormality. 2. Mild osseous demineralization. Electronically Signed   By: Evangeline Dakin M.D.   On: 08/01/2020 18:10    ____________________________________________    PROCEDURES  Procedure(s) performed:    Procedures    Medications  Tdap (BOOSTRIX) injection 0.5 mL (0.5 mLs Intramuscular Given 08/01/20 1725)     ____________________________________________   INITIAL IMPRESSION / ASSESSMENT  AND PLAN / ED COURSE  Pertinent labs & imaging results that were available during my care of the patient were reviewed by me and considered in my medical decision making (see chart for details).  Review of the [place] CSRS was performed in accordance of the [place] prior to dispensing any controlled drugs.           Patient's diagnosis is consistent with fall, subgaleal hematoma, shoulder contusion, sacral contusion, hip contusion, constipation.  Patient presented to  emergency department with 2 mechanical falls.  Patient has bulbar ALS and states that she has difficulty ambulating especially transitioning from sitting to standing.  Patient attempted to stand today, started to lose her balance and did not have anything to hold onto.  Patient fell striking her head.  She did not lose consciousness.  Patient communicates by writing as she is nonverbal due to ALS.  Patient denied any headache, visual changes.  She was neurologically intact on exam.  Overall exam was reassuring.  Patient did have a very superficial abrasion to the scalp but no frank laceration requiring closure.  Imaging was performed due to patient's complaints, 2 falls in the last 2 days.  Imaging revealed subgaleal hematoma but no evidence of skull fracture or intracranial hemorrhage.  I discussed the case with on-call neurosurgeon, Dr. Cari Caraway who advises that without underlying skull fracture or intracranial hemorrhage patient could be treated at home with symptom control medication for any headache that develops.  Incidentally on imaging, patient was found to be constipated.  Patient does suffer from chronic constipation, uses MiraLAX and Colace.  I will change Colace to Senokot and add mag citrate.  Patient is given ED precautions to return to the ED for any worsening or new symptoms.     ____________________________________________  FINAL CLINICAL IMPRESSION(S) / ED DIAGNOSES  Final diagnoses:  Fall, initial encounter  Subgaleal hemorrhage  Contusion of right shoulder, initial encounter  Contusion of sacrum, initial encounter  Contusion of right hip, initial encounter  ALS (amyotrophic lateral sclerosis) ([place])      NEW MEDICATIONS STARTED DURING THIS VISIT:  ED Discharge Orders         Ordered    senna-docusate (SENOKOT-S) 8.6-50 MG tablet  Daily        08/01/20 1911    magnesium citrate SOLN   Once       Note to Pharmacy: 10 bottles   08/01/20 1911              This  chart was dictated using voice recognition software/Dragon. Despite best efforts to proofread, errors can occur which can change the meaning. Any change was purely unintentional.    Darletta Moll, PA-C 08/01/20 1912    Delman Kitten, MD 08/01/20 [phone]

## 2020-08-08 ENCOUNTER — Other Ambulatory Visit: Payer: Self-pay

## 2020-08-08 ENCOUNTER — Other Ambulatory Visit: Payer: Medicare Other | Admitting: Adult Health Nurse Practitioner

## 2020-09-07 DEATH — deceased

## 2022-11-05 IMAGING — CT CT CERVICAL SPINE W/O CM
3 of 4 series · 11 of 33 positions shown, 13 images · non-contrast
Comparison: CT head and C-spine 10/15/2019.

CLINICAL DATA: Status post fall.  Loss of consciousness.

EXAM:
CT HEAD WITHOUT CONTRAST
CT CERVICAL SPINE WITHOUT CONTRAST
TECHNIQUE: Multidetector CT imaging of the head and cervical spine was
performed following the standard protocol without intravenous
contrast. Multiplanar CT image reconstructions of the cervical spine
were also generated.

[Series 6: sagittal bone · sagittal · 0.27mm/px · 5 of 47 slices shown, 6 images]
[im 16/47  bone]
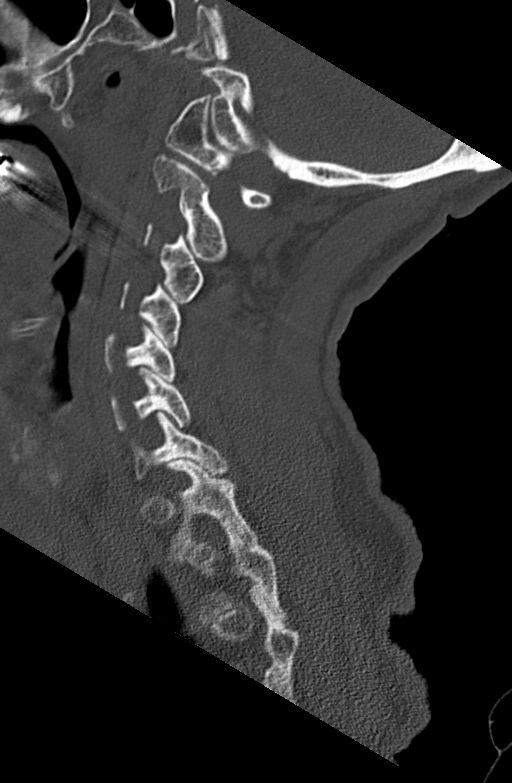
[im 20/47  bone]
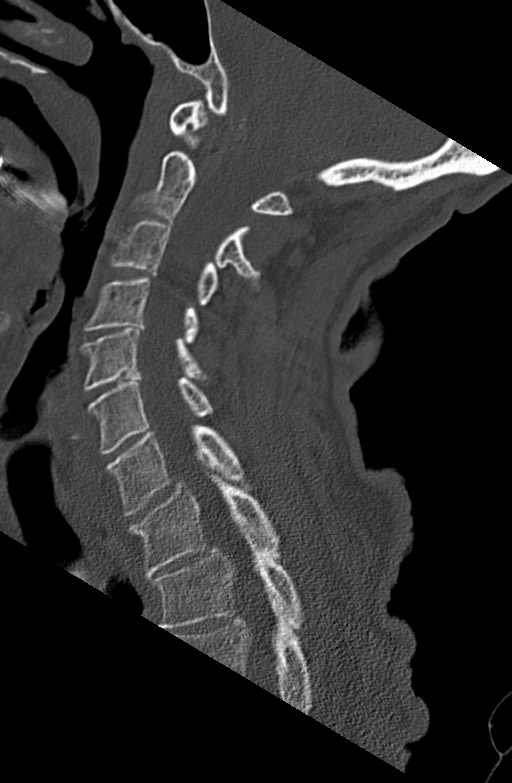
[im 24/47  soft-tissue]
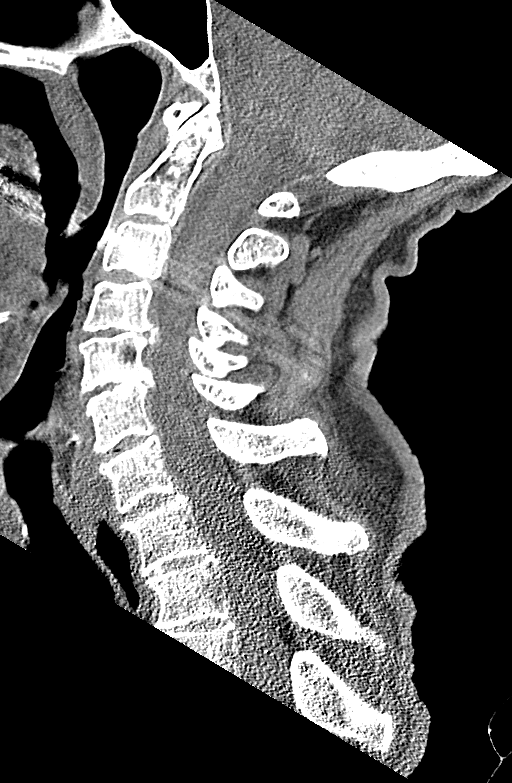
[im 24/47  bone]
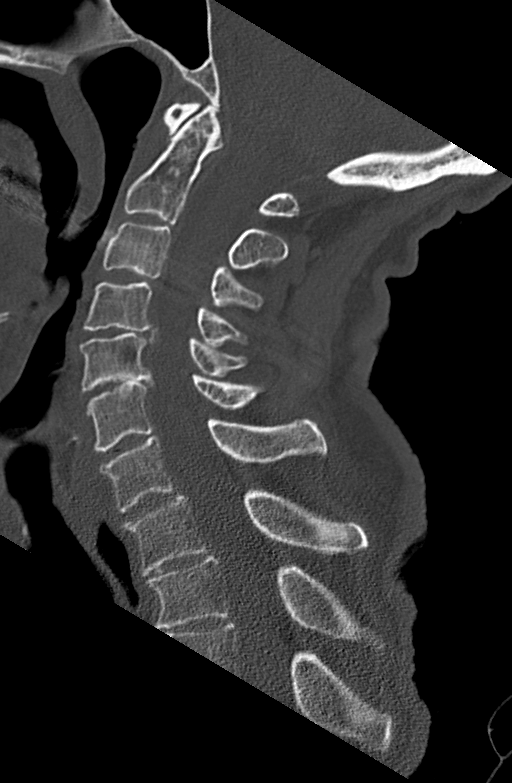
[im 27/47  bone]
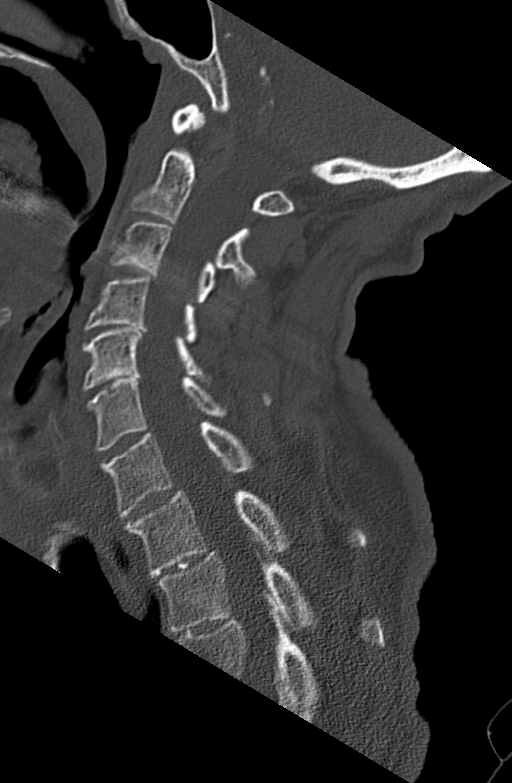
[im 31/47  bone]
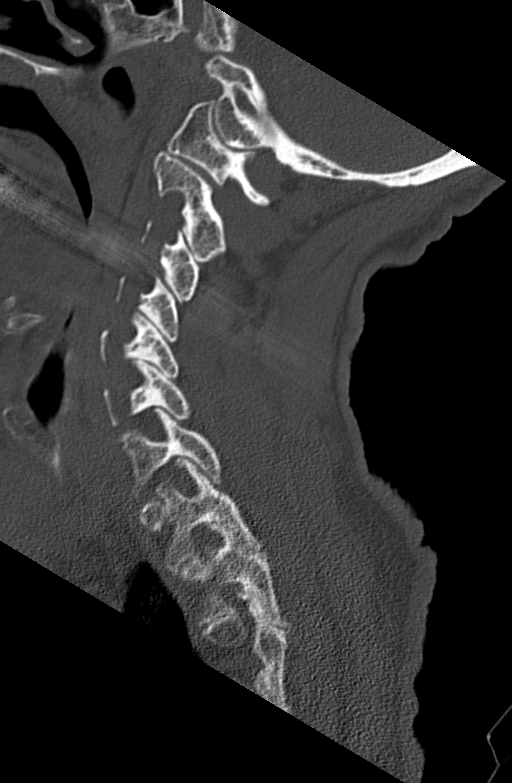

[Series 7: coronal bone · coronal · 0.21mm/px · 3 of 59 slices shown]
[im 16/59  bone]
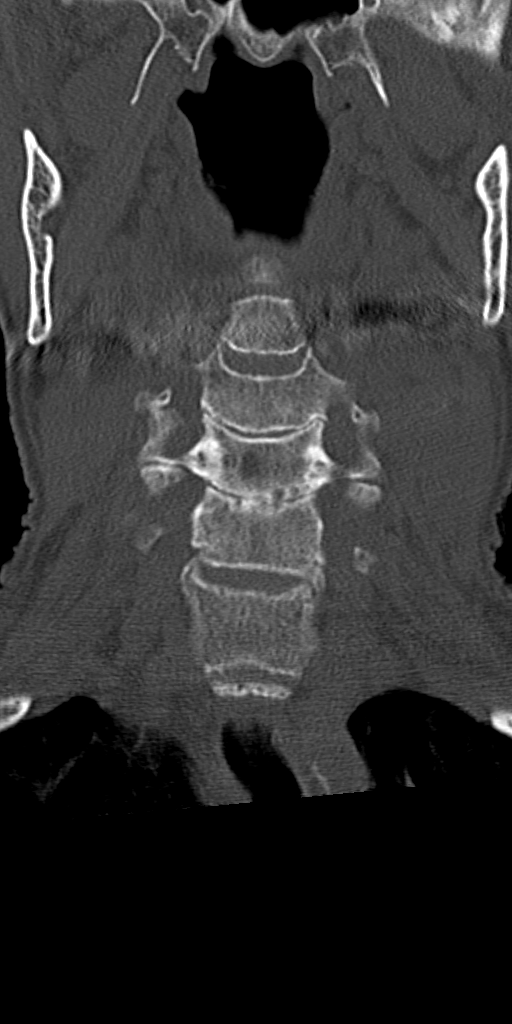
[im 25/59  bone]
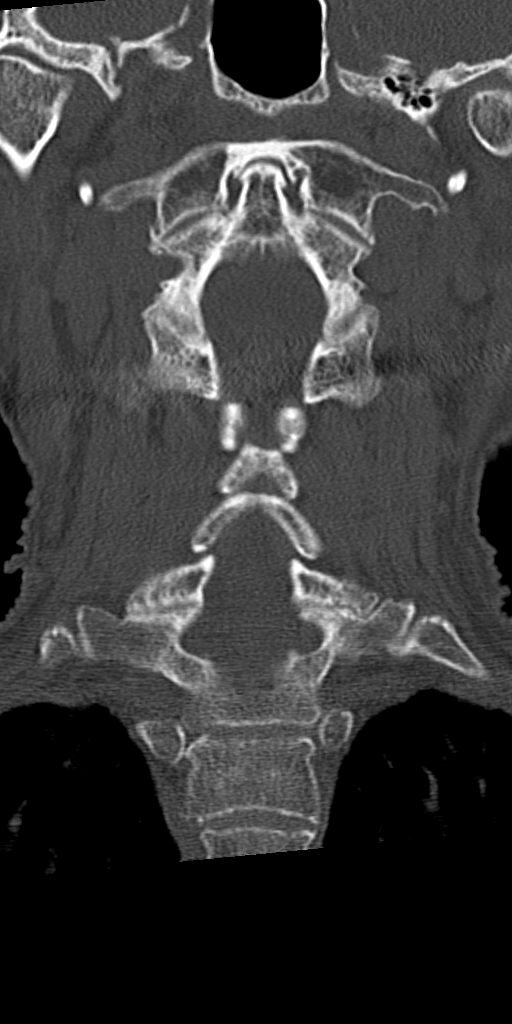
[im 34/59  bone]
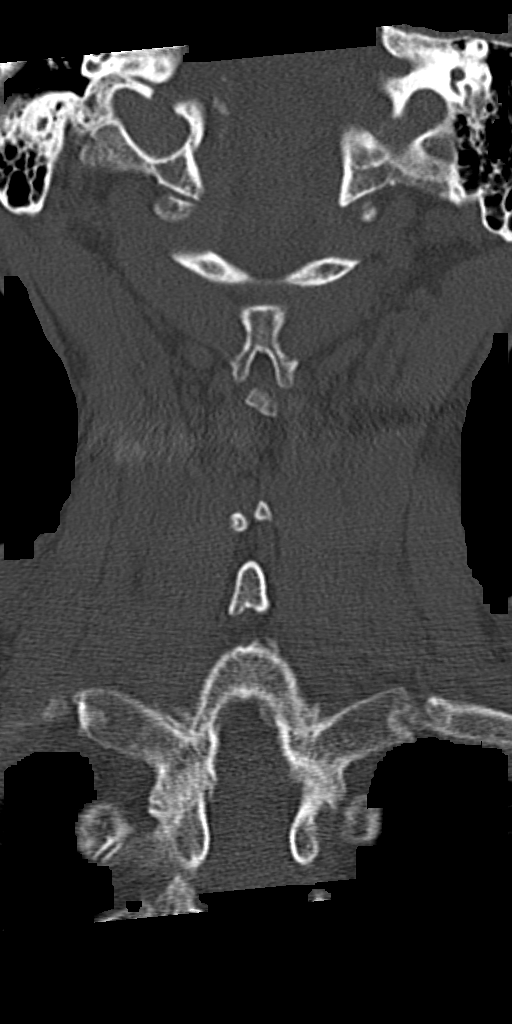

[Series 8: orthogonal bone · axial · 0.23mm/px · z∈[-301,-201]mm · 3 of 105 slices shown, 4 images]
[im 30/105  soft-tissue]
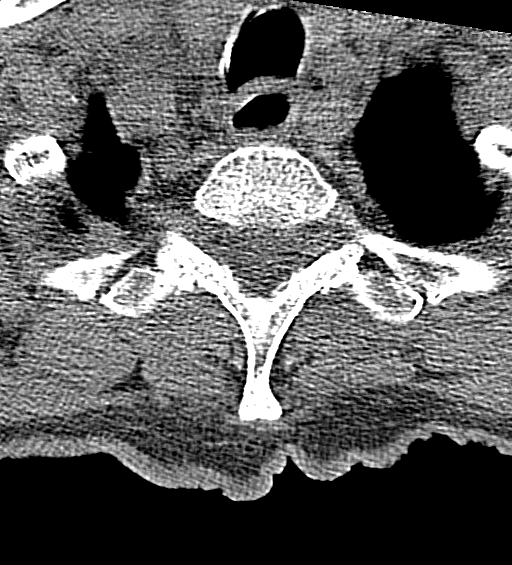
[im 30/105  bone]
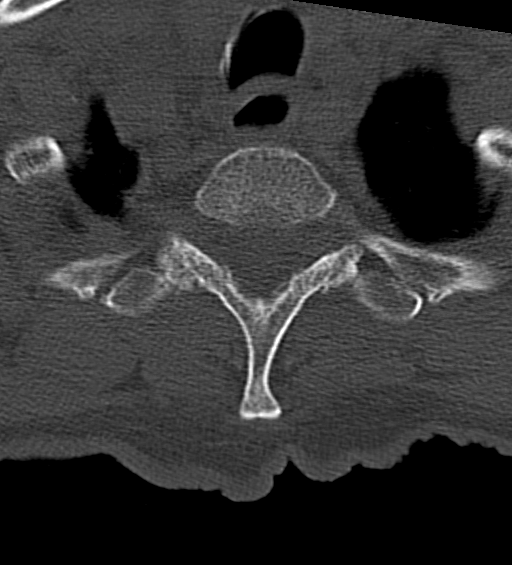
[im 60/105  bone]
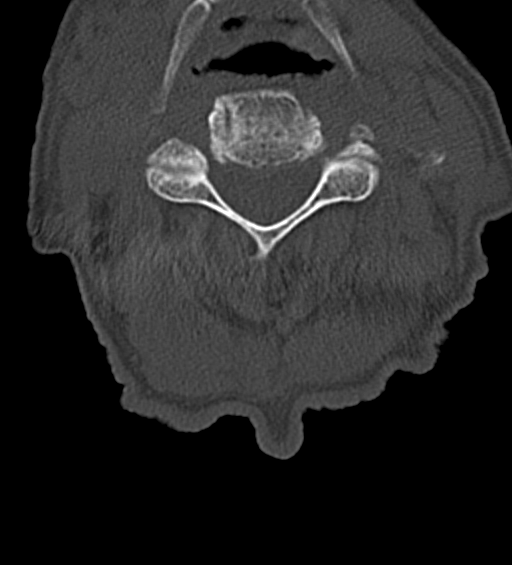
[im 90/105  bone]
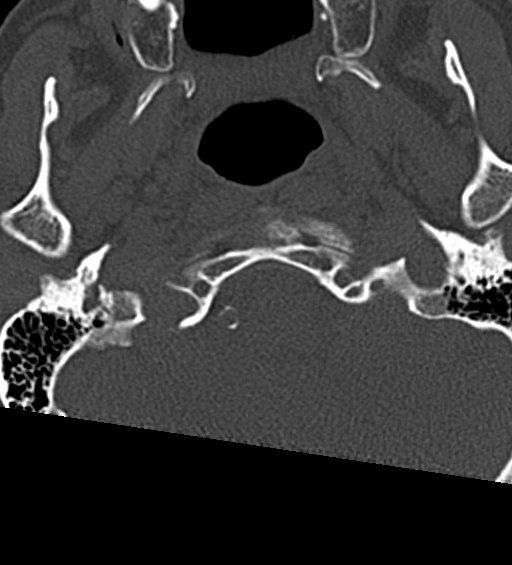

[11 of 33 positions shown; findings below may reference images not displayed]

FINDINGS: CT HEAD FINDINGS

Brain:

Cerebral ventricle sizes are concordant with the degree of cerebral
volume loss.

Patchy and confluent areas of decreased attenuation are noted
throughout the deep and periventricular white matter of the cerebral
hemispheres bilaterally, compatible with chronic microvascular
ischemic disease.

No evidence of large-territorial acute infarction. No parenchymal
hemorrhage. No mass lesion. No extra-axial collection.

No mass effect or midline shift. No hydrocephalus. Basilar cisterns
are patent.

Vascular: No hyperdense vessel. Atherosclerotic calcifications are
present within the cavernous internal carotid and vertebral
arteries.

Skull: No acute fracture or focal lesion.

Sinuses/Orbits: Paranasal sinuses and mastoid air cells are clear.
The orbits are unremarkable.

Other: A 0.3 cm left occipitotemporal subgaleal hematoma.

CT CERVICAL SPINE FINDINGS

Alignment: Grade 1 anterolisthesis of C7 on T1.

Skull base and vertebrae: Multilevel degenerative changes of the
spine worse at the C5-C6 levels. No acute fracture. No aggressive
appearing focal osseous lesion or focal pathologic process.

Soft tissues and spinal canal: No prevertebral fluid or swelling. No
visible canal hematoma.

Disc levels: Intervertebral disc space narrowing worse at the C5-C6
levels.

Upper chest: Unremarkable.

Other: None.
IMPRESSION: 1. No acute intracranial abnormality in a patient with a trace left
occipitotemporal subgaleal hematoma.
2. No acute displaced fracture or traumatic listhesis of the
cervical spine.

## 2022-11-05 IMAGING — CR DG SACRUM/COCCYX 2+V
1 series · 3 of 3 positions shown · non-contrast
Comparison: None.

CLINICAL DATA: 79-year-old who fell earlier today and landed on her
RIGHT side. Current history of ALS. Initial encounter.

EXAM:
SACRUM AND COCCYX - 2+ VIEW

[Series 1: dg sacrum/coccyx · 0.14mm/px · 3 of 3 slices shown]
[im 1/3]
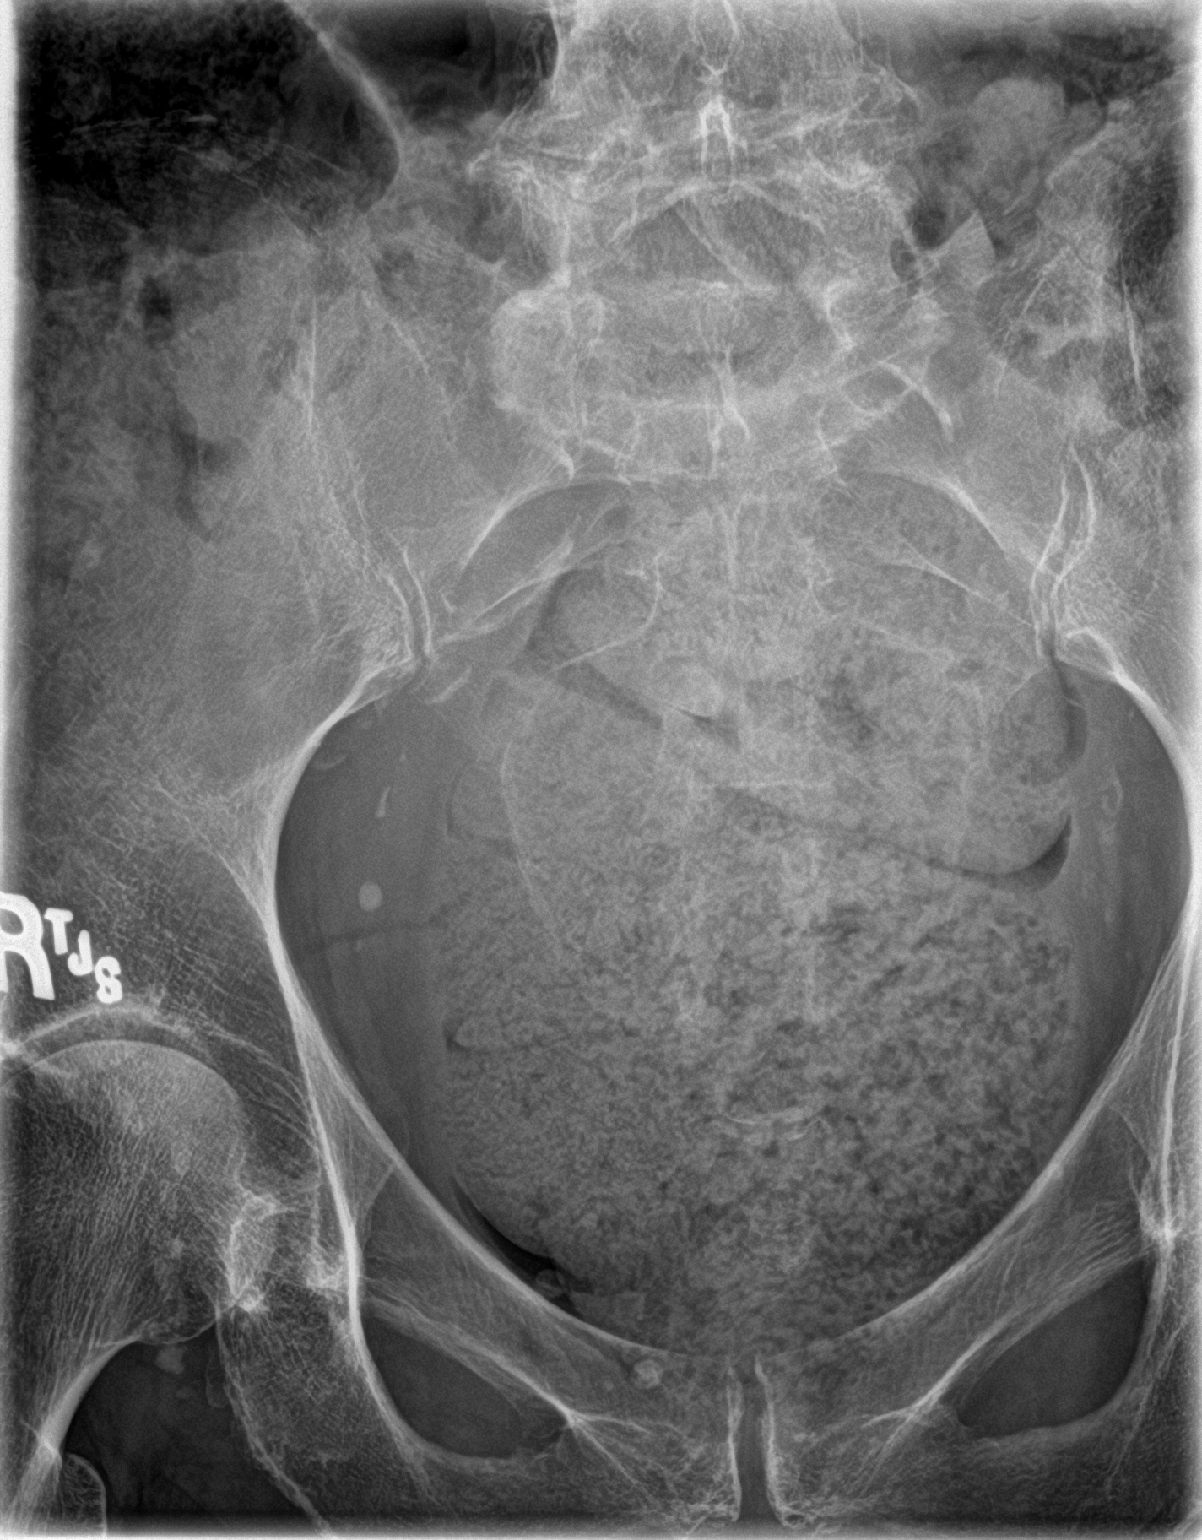
[im 2/3]
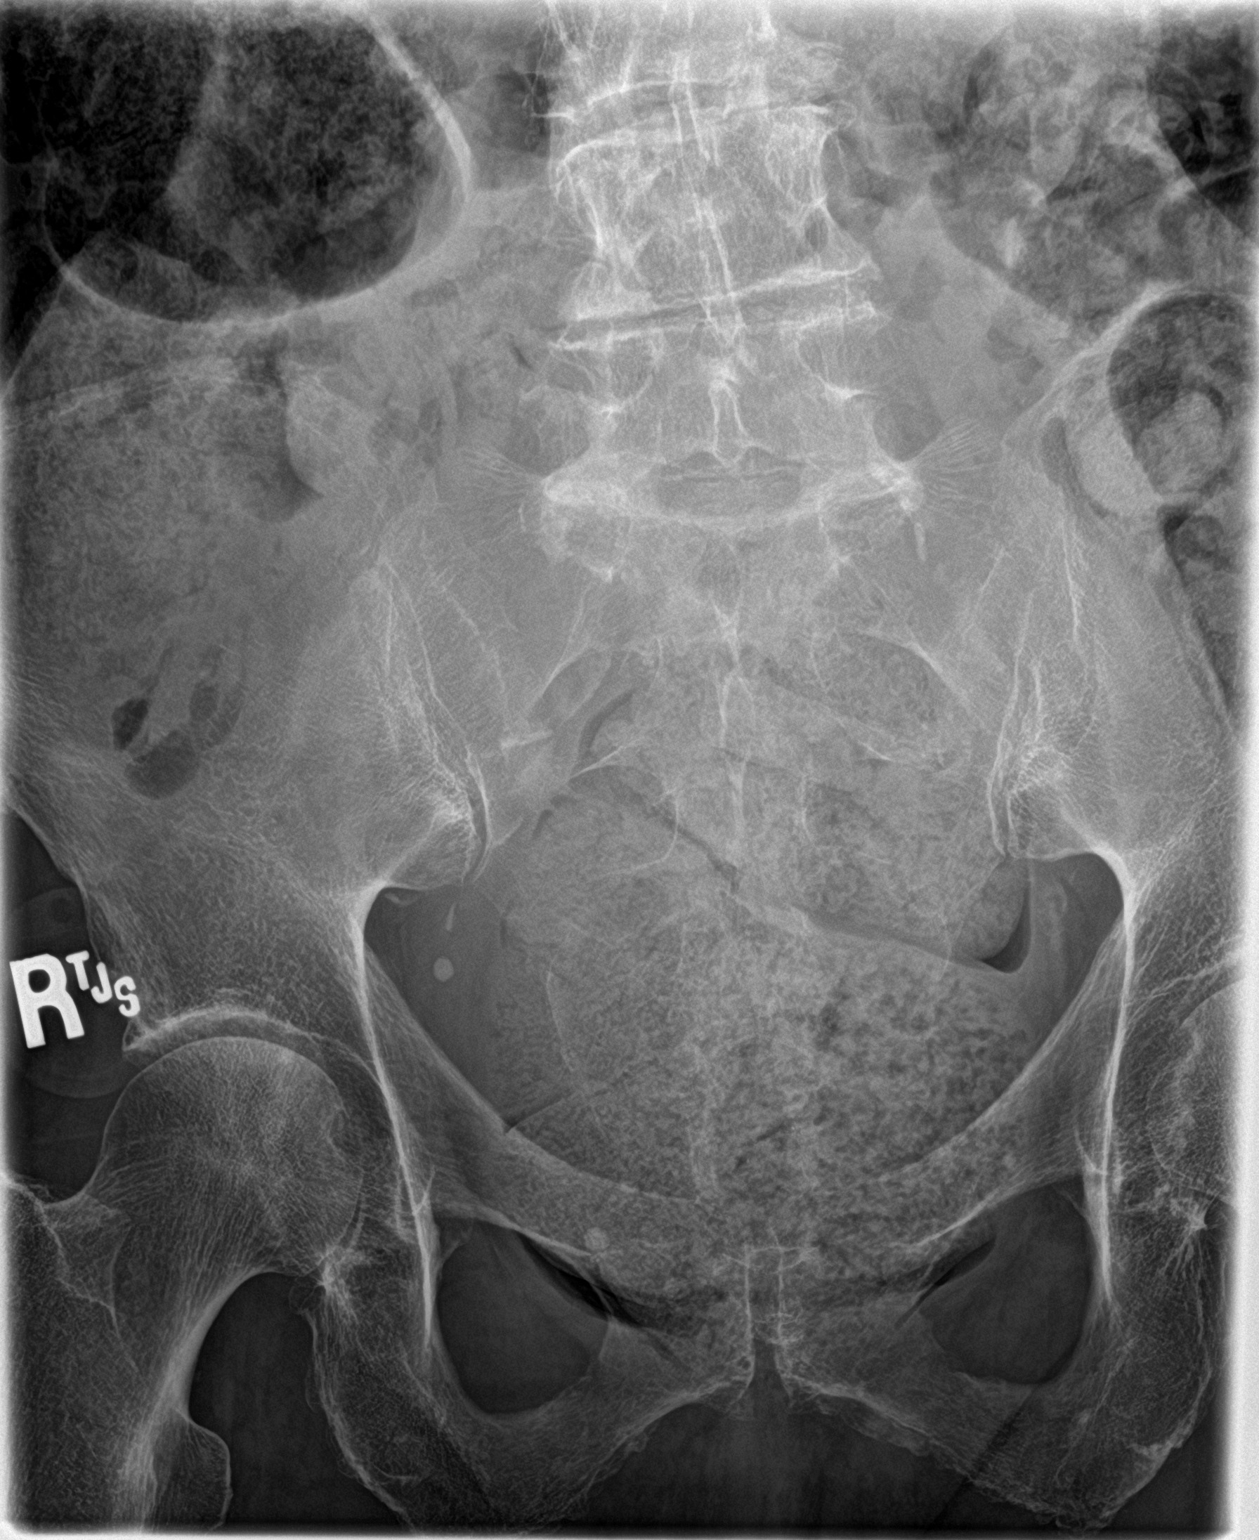
[im 3/3]
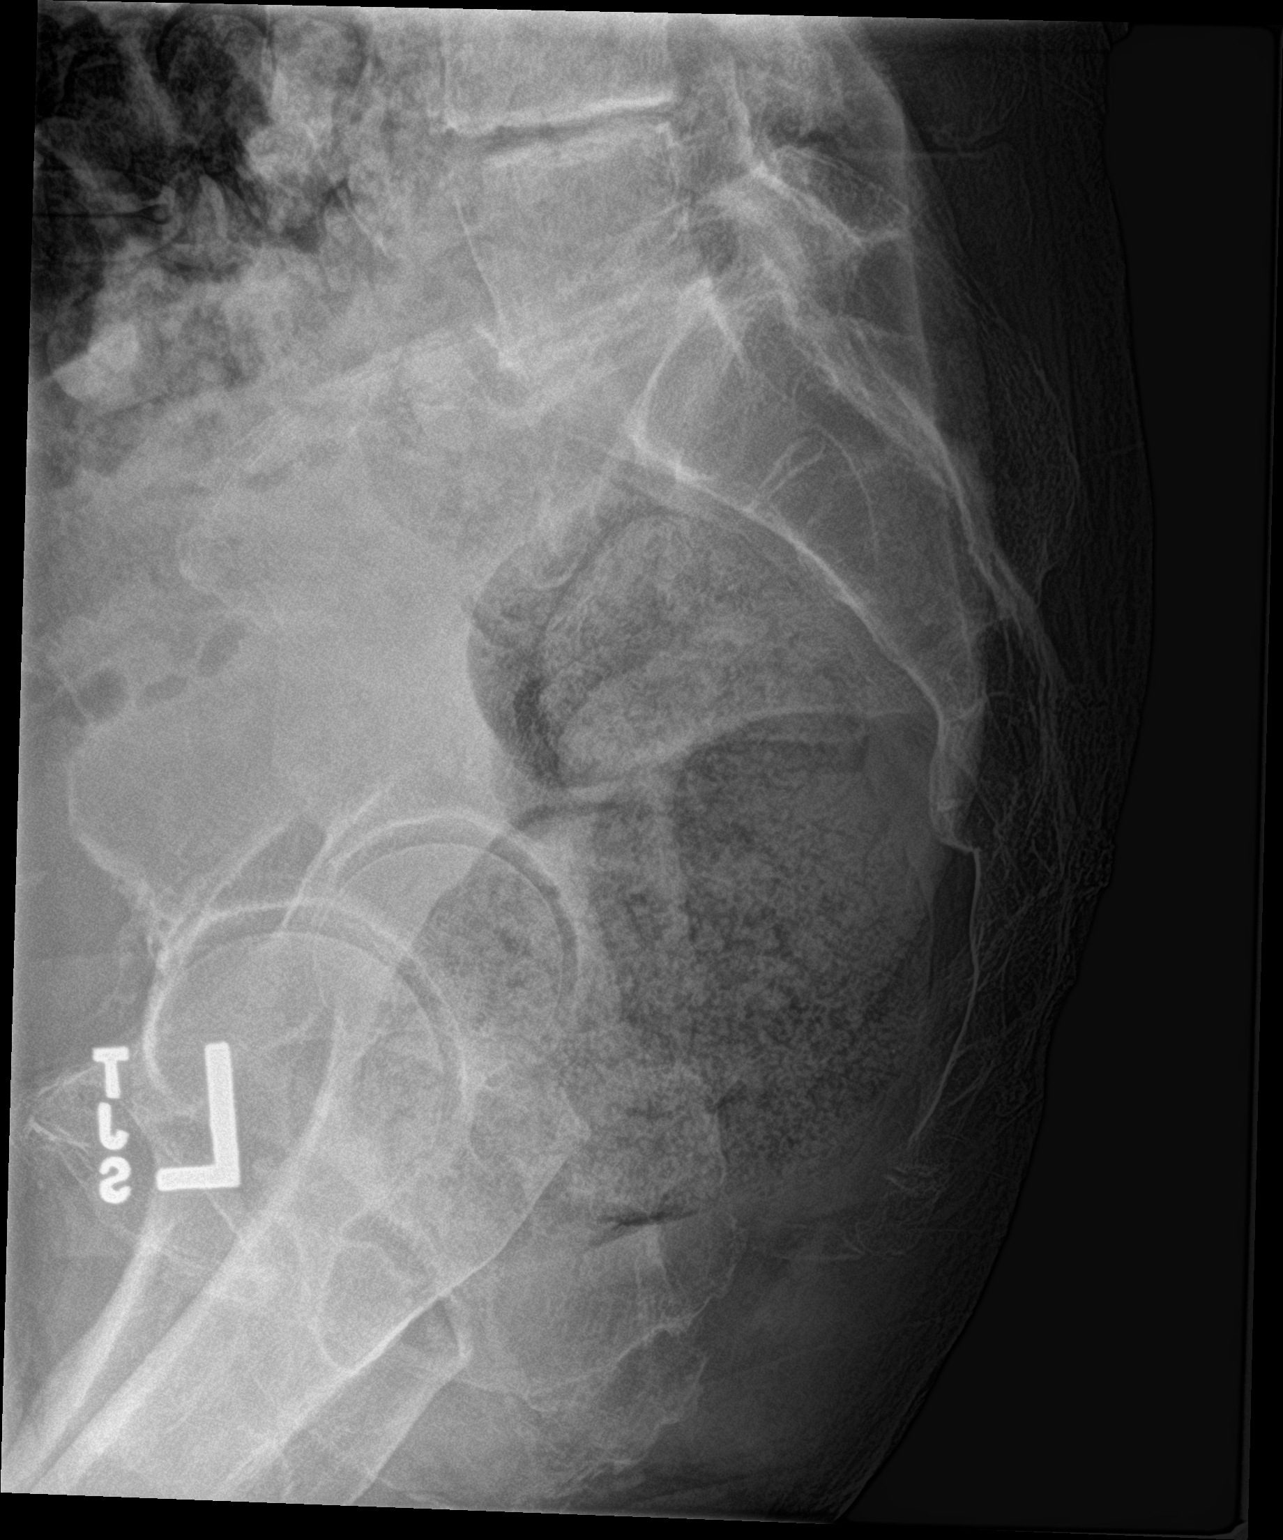

[3 of 3 positions shown; findings below may reference images not displayed]

FINDINGS: Osseous demineralization. No evidence of acute fracture involving
the sacrum or coccyx. Sacroiliac joints anatomically aligned with
mild degenerative changes.

Note is made of a large amount of stool in the rectum, query fecal
impaction.
IMPRESSION: 1. No evidence of acute fracture involving the sacrum or coccyx.
2. Osseous demineralization.
3. Possible rectal fecal impaction.
